# Patient Record
Sex: Male | Born: 1960 | Race: Black or African American | Hispanic: No | Marital: Married | Smoking: Never smoker
Health system: Southern US, Community
[De-identification: ages and names within clinical notes are randomized; demographics above are authoritative.]

## PROBLEM LIST (undated history)

## (undated) ENCOUNTER — Emergency Department (HOSPITAL_BASED_OUTPATIENT_CLINIC_OR_DEPARTMENT_OTHER): Admission: EM

## (undated) DIAGNOSIS — N400 Enlarged prostate without lower urinary tract symptoms: Secondary | ICD-10-CM

## (undated) DIAGNOSIS — K589 Irritable bowel syndrome without diarrhea: Secondary | ICD-10-CM

## (undated) DIAGNOSIS — I10 Essential (primary) hypertension: Secondary | ICD-10-CM

## (undated) DIAGNOSIS — K219 Gastro-esophageal reflux disease without esophagitis: Secondary | ICD-10-CM

---

## 2016-10-07 ENCOUNTER — Other Ambulatory Visit: Payer: Self-pay | Admitting: Orthopedic Surgery

## 2016-10-07 DIAGNOSIS — M4726 Other spondylosis with radiculopathy, lumbar region: Secondary | ICD-10-CM

## 2016-10-11 ENCOUNTER — Ambulatory Visit
Admission: RE | Admit: 2016-10-11 | Discharge: 2016-10-11 | Disposition: A | Discharge status: Home / Self Care | Payer: Self-pay | Source: Ambulatory Visit | Attending: Orthopedic Surgery | Admitting: Orthopedic Surgery

## 2016-10-11 DIAGNOSIS — M4726 Other spondylosis with radiculopathy, lumbar region: Secondary | ICD-10-CM

## 2019-12-09 ENCOUNTER — Encounter (HOSPITAL_BASED_OUTPATIENT_CLINIC_OR_DEPARTMENT_OTHER): Payer: Self-pay | Admitting: Emergency Medicine

## 2019-12-09 ENCOUNTER — Emergency Department (HOSPITAL_BASED_OUTPATIENT_CLINIC_OR_DEPARTMENT_OTHER): Payer: BC Managed Care – PPO

## 2019-12-09 ENCOUNTER — Other Ambulatory Visit: Payer: Self-pay

## 2019-12-09 ENCOUNTER — Emergency Department (HOSPITAL_BASED_OUTPATIENT_CLINIC_OR_DEPARTMENT_OTHER)
Admission: EM | Admit: 2019-12-09 | Discharge: 2019-12-09 | Disposition: A | Discharge status: Home / Self Care | Payer: BC Managed Care – PPO | Attending: Emergency Medicine | Admitting: Emergency Medicine

## 2019-12-09 DIAGNOSIS — R109 Unspecified abdominal pain: Secondary | ICD-10-CM | POA: Insufficient documentation

## 2019-12-09 LAB — URINALYSIS, ROUTINE W REFLEX MICROSCOPIC
Bilirubin Urine: NEGATIVE
Glucose, UA: NEGATIVE mg/dL
Hgb urine dipstick: NEGATIVE
Ketones, ur: NEGATIVE mg/dL
Leukocytes,Ua: NEGATIVE
Nitrite: NEGATIVE
Protein, ur: NEGATIVE mg/dL
Specific Gravity, Urine: >1.03 — ABNORMAL HIGH (ref 1.005–1.030)
pH: 5.5 (ref 5.0–8.0)

## 2019-12-09 LAB — BASIC METABOLIC PANEL
Anion gap: 9 (ref 5–15)
BUN: 19 mg/dL (ref 6–20)
CO2: 26 mmol/L (ref 22–32)
Calcium: 9 mg/dL (ref 8.9–10.3)
Chloride: 102 mmol/L (ref 98–111)
Creatinine, Ser: 1.33 mg/dL — ABNORMAL HIGH (ref 0.61–1.24)
GFR calc Af Amer: >60 mL/min (ref >60)
GFR calc non Af Amer: 59 mL/min — ABNORMAL LOW (ref >60)
Glucose, Bld: 97 mg/dL (ref 70–99)
Potassium: 4.3 mmol/L (ref 3.5–5.1)
Sodium: 137 mmol/L (ref 135–145)

## 2019-12-09 MED ORDER — LIDOCAINE 5 % EX PTCH: 1.0000 | MEDICATED_PATCH | CUTANEOUS | 0 refills | Status: AC

## 2019-12-09 MED ORDER — NAPROXEN 375 MG PO TABS
375.0000 mg | ORAL_TABLET | Freq: Two times a day (BID) | ORAL | 0 refills | Status: AC
Start: 2019-12-09 — End: ?

## 2019-12-09 MED ORDER — LIDOCAINE 5 % EX PTCH
2.0000 | MEDICATED_PATCH | CUTANEOUS | Status: DC
  Administered 2019-12-09: 2 via TRANSDERMAL
  Filled 2019-12-09: qty 2

## 2019-12-09 MED ORDER — ACETAMINOPHEN 500 MG PO TABS
1000.0000 mg | ORAL_TABLET | Freq: Once | ORAL | Status: AC
  Administered 2019-12-09: 1000 mg via ORAL
  Filled 2019-12-09: qty 2

## 2019-12-09 MED ORDER — IBUPROFEN 800 MG PO TABS
800.0000 mg | ORAL_TABLET | Freq: Once | ORAL | Status: AC
  Administered 2019-12-09: 800 mg via ORAL
  Filled 2019-12-09: qty 1

## 2019-12-09 NOTE — ED Provider Notes (Signed)
[facility] EMERGENCY DEPARTMENT Provider Note   CSN: [id number] Arrival date & time: 12/09/19  0346     History Chief Complaint  Patient presents with  . Flank Pain    Harry Spears is a 59 y.o. male.  The history is provided by the patient.  Flank Pain This is a new problem. The current episode started more than 2 days ago. The problem occurs constantly. The problem has not changed since onset.Pertinent negatives include no chest pain, no abdominal pain, no headaches and no shortness of breath. Nothing aggravates the symptoms. Nothing relieves the symptoms. He has tried nothing for the symptoms. The treatment provided no relief.       History reviewed. No pertinent past medical history.  There are no problems to display for this patient.   History reviewed. No pertinent surgical history.     History reviewed. No pertinent family history.  Social History   Tobacco Use  . Smoking status: Never Smoker  . Smokeless tobacco: Never Used  Substance Use Topics  . Alcohol use: Not Currently  . Drug use: Not Currently    Home Medications Prior to Admission medications   Not on File    Allergies    Patient has no allergy information on record.  Review of Systems   Review of Systems  Constitutional: Negative for fever.  HENT: Negative for congestion.   Eyes: Negative for visual disturbance.  Respiratory: Negative for shortness of breath.   Cardiovascular: Negative for chest pain.  Gastrointestinal: Negative for abdominal pain.  Genitourinary: Positive for flank pain. Negative for dysuria.  Musculoskeletal: Negative for arthralgias.  Skin: Negative for rash.  Neurological: Negative for headaches.  Psychiatric/Behavioral: Negative for agitation.  All other systems reviewed and are negative.   Physical Exam Updated Vital Signs BP (!) 145/69 (BP Location: Right Arm)   Pulse 72   Temp 98.5 F (36.9 C) (Oral)   Resp 18   Ht 5\' 11"  (1.803 m)   Wt 93.4  kg   SpO2 98%   BMI 28.73 kg/m   Physical Exam Vitals and nursing note reviewed.  Constitutional:      General: He is not in acute distress. HENT:     Head: Normocephalic and atraumatic.     Nose: Nose normal.  Eyes:     Conjunctiva/sclera: Conjunctivae normal.     Pupils: Pupils are equal, round, and reactive to light.  Cardiovascular:     Rate and Rhythm: Normal rate and regular rhythm.     Pulses: Normal pulses.     Heart sounds: Normal heart sounds.  Pulmonary:     Effort: Pulmonary effort is normal.     Breath sounds: Normal breath sounds.  Abdominal:     General: Abdomen is flat. Bowel sounds are normal.     Tenderness: There is no abdominal tenderness. There is no guarding or rebound.  Musculoskeletal:        General: Normal range of motion.     Cervical back: Normal range of motion and neck supple.  Skin:    General: Skin is warm and dry.     Capillary Refill: Capillary refill takes less than 2 seconds.  Neurological:     General: No focal deficit present.     Mental Status: He is alert and oriented to person, place, and time. Mental status is at baseline.     Deep Tendon Reflexes: Reflexes normal.  Psychiatric:        Mood and Affect: Mood normal.  Behavior: Behavior normal.     ED Results / Procedures / Treatments   Labs (all labs ordered are listed, but only abnormal results are displayed) Results for orders placed or performed during the hospital encounter of 12/09/19  Urinalysis, Routine w reflex microscopic Urine, Random  Result Value Ref Range   Color, Urine YELLOW YELLOW   APPearance CLEAR CLEAR   Specific Gravity, Urine >1.030 (H) 1.005 - 1.030   pH 5.5 5.0 - 8.0   Glucose, UA NEGATIVE NEGATIVE mg/dL   Hgb urine dipstick NEGATIVE NEGATIVE   Bilirubin Urine NEGATIVE NEGATIVE   Ketones, ur NEGATIVE NEGATIVE mg/dL   Protein, ur NEGATIVE NEGATIVE mg/dL   Nitrite NEGATIVE NEGATIVE   Leukocytes,Ua NEGATIVE NEGATIVE  Basic metabolic panel    Result Value Ref Range   Sodium 137 135 - 145 mmol/L   Potassium 4.3 3.5 - 5.1 mmol/L   Chloride 102 98 - 111 mmol/L   CO2 26 22 - 32 mmol/L   Glucose, Bld 97 70 - 99 mg/dL   BUN 19 6 - 20 mg/dL   Creatinine, Ser 2.67 (H) 0.61 - 1.24 mg/dL   Calcium 9.0 8.9 - 12.4 mg/dL   GFR calc non Af Amer 59 (L) >60 mL/min   GFR calc Af Amer >60 >60 mL/min   Anion gap 9 5 - 15   CT Renal Stone Study  Result Date: 12/09/2019 CLINICAL DATA:  Left flank pain EXAM: CT ABDOMEN AND PELVIS WITHOUT CONTRAST TECHNIQUE: Multidetector CT imaging of the abdomen and pelvis was performed following the standard protocol without IV contrast. COMPARISON:  08/01/2017 FINDINGS: Lower chest: Lung bases are clear. Hepatobiliary: Unenhanced liver is unremarkable. Gallbladder is unremarkable. No intrahepatic or extrahepatic ductal dilatation. Pancreas: Within normal limits. Spleen: Within normal limits. Adrenals/Urinary Tract: Adrenal glands are within normal limits. Bilateral renal cysts, measuring up to 2.2 cm in the right lower pole and 2.4 cm in the left lower pole. No renal, ureteral, or bladder calculi. No hydronephrosis. Bladder is within normal limits. Stomach/Bowel: Stomach is within normal limits. No evidence of bowel obstruction. Normal appendix (series 2/image 44). Vascular/Lymphatic: No evidence of abdominal aortic aneurysm. Atherosclerotic calcifications of the abdominal aorta and branch vessels. No suspicious abdominopelvic lymphadenopathy. Reproductive: Mild prostatomegaly. Other: No abdominopelvic ascites. Musculoskeletal: Degenerative changes of the visualized thoracolumbar spine. Mild fatty muscular atrophy on the right (series 2/image 72), chronic. IMPRESSION: No renal, ureteral, or bladder calculi.  No hydronephrosis. Bilateral renal cysts, measuring up to 2.4 cm on the left, benign. Electronically Signed   By: Charline Bills M.D.   On: 12/09/2019 04:42    Procedures Procedures (including critical care  time)  Medications Ordered in ED Medications  acetaminophen (TYLENOL) tablet 1,000 mg (has no administration in time range)  ibuprofen (ADVIL) tablet 800 mg (has no administration in time range)  lidocaine (LIDODERM) 5 % 2 patch (has no administration in time range)    ED Course  I have reviewed the triage vital signs and the nursing notes.  Pertinent labs & imaging results that were available during my care of the patient were reviewed by me and considered in my medical decision making (see chart for details).  Likely MSK cause of pain.  No UTI no stones.  Tylenol, lidoderm and naproxen for pain.    Harry Spears was evaluated in Emergency Department on 12/09/2019 for the symptoms described in the history of present illness. He was evaluated in the context of the global COVID-19 pandemic, which necessitated consideration that the patient  might be at risk for infection with the SARS-CoV-2 virus that causes COVID-19. Institutional protocols and algorithms that pertain to the evaluation of patients at risk for COVID-19 are in a state of rapid change based on information released by regulatory bodies including the CDC and federal and state organizations. These policies and algorithms were followed during the patient's care in the ED.  Final Clinical Impression(s) / ED Diagnoses  Return for intractable cough, coughing up blood,fevers >100.4 unrelieved by medication, shortness of breath, intractable vomiting, chest pain, shortness of breath, weakness,numbness, changes in speech, facial asymmetry,abdominal pain, passing out,Inability to tolerate liquids or food, cough, altered mental status or any concerns. No signs of systemic illness or infection. The patient is nontoxic-appearing on exam and vital signs are within normal limits.   I have reviewed the triage vital signs and the nursing notes. Pertinent labs &imaging results that were available during my care of the patient were reviewed by me  and considered in my medical decision making (see chart for details).After history, exam, and medical workup I feel the patient has beenappropriately medically screened and is safe for discharge home. Pertinent diagnoses were discussed with the patient. Patient was given return precautions.        Harry Sturdevant, MD 12/09/19 [phone]

## 2019-12-09 NOTE — ED Triage Notes (Signed)
Pt c/o left flank pain for the past few days and feeling bloated, pt denies any urinary symptoms.

## 2019-12-13 ENCOUNTER — Emergency Department (HOSPITAL_BASED_OUTPATIENT_CLINIC_OR_DEPARTMENT_OTHER)
Admission: EM | Admit: 2019-12-13 | Discharge: 2019-12-13 | Disposition: A | Discharge status: Home / Self Care | Payer: BC Managed Care – PPO | Attending: Emergency Medicine | Admitting: Emergency Medicine

## 2019-12-13 ENCOUNTER — Other Ambulatory Visit: Payer: Self-pay

## 2019-12-13 ENCOUNTER — Encounter (HOSPITAL_BASED_OUTPATIENT_CLINIC_OR_DEPARTMENT_OTHER): Payer: Self-pay

## 2019-12-13 DIAGNOSIS — K5900 Constipation, unspecified: Secondary | ICD-10-CM | POA: Diagnosis not present

## 2019-12-13 LAB — OCCULT BLOOD X 1 CARD TO LAB, STOOL: Fecal Occult Bld: NEGATIVE

## 2019-12-13 MED ORDER — POLYETHYLENE GLYCOL 3350 17 GM/SCOOP PO POWD: 1.0000 | Freq: Once | ORAL | 0 refills | Status: AC

## 2019-12-13 NOTE — ED Triage Notes (Signed)
Pt c/o constipation x 4 days-NAD-steady gait

## 2019-12-13 NOTE — Discharge Instructions (Addendum)

## 2019-12-13 NOTE — ED Provider Notes (Signed)
[facility] EMERGENCY DEPARTMENT Provider Note   CSN: [id number] Arrival date & time: 12/13/19  1126     History Chief Complaint  Patient presents with  . Constipation    Harry Spears is a 59 y.o. male.  HPI Patient is a 59 year old gentleman with no pertinent past medical history presented today for constipation for 4 days.  He states he actually has been having bowel movements but they have been less frequent.  He states approximately every other day.  He had a bowel movement this morning that was soft brown nontarry nonpainful.  Denies any bright red blood per rectum.  Denies any abdominal pain nausea vomiting or diarrhea. He states he feels otherwise well but states that his normal baseline bowel movements are once per day and he is concerned about his change in bowel movement status.     History reviewed. No pertinent past medical history.  There are no problems to display for this patient.   History reviewed. No pertinent surgical history.     No family history on file.  Social History   Tobacco Use  . Smoking status: Never Smoker  . Smokeless tobacco: Never Used  Vaping Use  . Vaping Use: Never used  Substance Use Topics  . Alcohol use: Not Currently  . Drug use: Not Currently    Home Medications Prior to Admission medications   Medication Sig Start Date End Date Taking? Authorizing Provider  lidocaine (LIDODERM) 5 % Place 1 patch onto the skin daily. Remove & Discard patch within 12 hours or as directed by MD 12/09/19   Nicanor Alcon, April, MD  naproxen (NAPROSYN) 375 MG tablet Take 1 tablet (375 mg total) by mouth 2 (two) times daily. 12/09/19   Palumbo, April, MD  polyethylene glycol powder (GLYCOLAX/MIRALAX) 17 GM/SCOOP powder Take 255 g by mouth once for 1 dose. Start with one cap per meal in water. You may slowly titrate up until you have soft normal BMs. 12/13/19 12/13/19  Gailen Shelter, PA    Allergies    Patient has no known allergies.  Review  of Systems   Review of Systems  Constitutional: Negative for chills and fever.  HENT: Negative for congestion.   Respiratory: Negative for shortness of breath.   Cardiovascular: Negative for chest pain.  Gastrointestinal: Positive for constipation. Negative for abdominal pain, nausea and vomiting.  Musculoskeletal: Negative for neck pain.    Physical Exam Updated Vital Signs BP (!) 157/93 (BP Location: Left Arm)   Pulse 74   Temp 98.8 F (37.1 C) (Oral)   Resp 18   Ht 5\' 10"  (1.778 m)   Wt 89.8 kg   SpO2 98%   BMI 28.41 kg/m   Physical Exam Vitals and nursing note reviewed. Exam conducted with a chaperone present RT).  Constitutional:      General: He is not in acute distress. HENT:     Head: Normocephalic and atraumatic.     Nose: Nose normal.  Eyes:     General: No scleral icterus. Cardiovascular:     Rate and Rhythm: Normal rate and regular rhythm.     Pulses: Normal pulses.     Heart sounds: Normal heart sounds.  Pulmonary:     Effort: Pulmonary effort is normal. No respiratory distress.     Breath sounds: No wheezing.  Abdominal:     Palpations: Abdomen is soft.     Tenderness: There is no abdominal tenderness. There is no right CVA tenderness, left CVA tenderness,  guarding or rebound.  Genitourinary:    Rectum: Normal.     Comments: No palpable stool in rectal vault.  Stool is soft and brown with no melena or hematochezia.  POC Hemoccult negative. Musculoskeletal:     Cervical back: Normal range of motion.     Right lower leg: No edema.     Left lower leg: No edema.  Skin:    General: Skin is warm and dry.     Capillary Refill: Capillary refill takes less than 2 seconds.  Neurological:     Mental Status: He is alert. Mental status is at baseline.  Psychiatric:        Mood and Affect: Mood normal.        Behavior: Behavior normal.     ED Results / Procedures / Treatments   Labs (all labs ordered are listed, but only abnormal results are  displayed) Labs Reviewed  OCCULT BLOOD X 1 CARD TO LAB, STOOL    EKG None  Radiology No results found.  Procedures Procedures (including critical care time)  Medications Ordered in ED Medications - No data to display  ED Course  I have reviewed the triage vital signs and the nursing notes.  Pertinent labs & imaging results that were available during my care of the patient were reviewed by me and considered in my medical decision making (see chart for details).    MDM Rules/Calculators/A&P                          Patient is 59 year old gentleman presented today with concern for change in bowel movements from once daily to once every other day.  He denies any abdominal pain nausea vomiting or diarrhea.  He has no other symptoms.  He states soft brown stool.  My examination was unremarkable.  He has no abdominal tenderness and has soft brown stool with no melena hematochezia.  Guaiac negative.  We will discharge patient with recommendations for MiraLAX use.  Doubt SBO as he is still passing gas and stool.  Doubt volvulus or any other acute abdominal process.  Patient discharged in good condition with vital signs within normal limits he will follow closely with gastroenterology to whom he was given referral and follow-up/return to the emergency department if he has any new or concerning symptoms.  Final Clinical Impression(s) / ED Diagnoses Final diagnoses:  Constipation, unspecified constipation type    Rx / DC Orders ED Discharge Orders         Ordered    polyethylene glycol powder (GLYCOLAX/MIRALAX) 17 GM/SCOOP powder   Once     Discontinue  Reprint     12/13/19 1241           Gailen Shelter, [place] 12/13/19 1244    Jacalyn Lefevre, MD 12/13/19 1446

## 2020-01-02 ENCOUNTER — Encounter (HOSPITAL_BASED_OUTPATIENT_CLINIC_OR_DEPARTMENT_OTHER): Payer: Self-pay | Admitting: *Deleted

## 2020-01-02 ENCOUNTER — Emergency Department (HOSPITAL_BASED_OUTPATIENT_CLINIC_OR_DEPARTMENT_OTHER): Payer: BC Managed Care – PPO

## 2020-01-02 ENCOUNTER — Other Ambulatory Visit: Payer: Self-pay

## 2020-01-02 ENCOUNTER — Emergency Department (HOSPITAL_BASED_OUTPATIENT_CLINIC_OR_DEPARTMENT_OTHER)
Admission: EM | Admit: 2020-01-02 | Discharge: 2020-01-03 | Disposition: A | Discharge status: Home / Self Care | Payer: BC Managed Care – PPO | Attending: Emergency Medicine | Admitting: Emergency Medicine

## 2020-01-02 DIAGNOSIS — Z79899 Other long term (current) drug therapy: Secondary | ICD-10-CM | POA: Insufficient documentation

## 2020-01-02 DIAGNOSIS — I10 Essential (primary) hypertension: Secondary | ICD-10-CM | POA: Insufficient documentation

## 2020-01-02 DIAGNOSIS — G609 Hereditary and idiopathic neuropathy, unspecified: Secondary | ICD-10-CM

## 2020-01-02 DIAGNOSIS — R531 Weakness: Secondary | ICD-10-CM | POA: Insufficient documentation

## 2020-01-02 DIAGNOSIS — Z23 Encounter for immunization: Secondary | ICD-10-CM | POA: Insufficient documentation

## 2020-01-02 HISTORY — DX: Essential (primary) hypertension: I10
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
## 2020-01-03 ENCOUNTER — Emergency Department (HOSPITAL_BASED_OUTPATIENT_CLINIC_OR_DEPARTMENT_OTHER): Payer: BC Managed Care – PPO

## 2020-01-03 ENCOUNTER — Encounter (HOSPITAL_BASED_OUTPATIENT_CLINIC_OR_DEPARTMENT_OTHER): Payer: Self-pay | Admitting: Emergency Medicine

## 2020-01-03 LAB — CBC WITH DIFFERENTIAL/PLATELET
Abs Immature Granulocytes: 0.01 10*3/uL (ref 0.00–0.07)
Basophils Absolute: 0 10*3/uL (ref 0.0–0.1)
Basophils Relative: 0 %
Eosinophils Absolute: 0 10*3/uL (ref 0.0–0.5)
Eosinophils Relative: 0 %
HCT: 46.3 % (ref 39.0–52.0)
Hemoglobin: 15.2 g/dL (ref 13.0–17.0)
Immature Granulocytes: 0 %
Lymphocytes Relative: 23 %
Lymphs Abs: 1.4 10*3/uL (ref 0.7–4.0)
MCH: 29 pg (ref 26.0–34.0)
MCHC: 32.8 g/dL (ref 30.0–36.0)
MCV: 88.2 fL (ref 80.0–100.0)
Monocytes Absolute: 0.6 10*3/uL (ref 0.1–1.0)
Monocytes Relative: 11 %
Neutro Abs: 4 10*3/uL (ref 1.7–7.7)
Neutrophils Relative %: 66 %
Platelets: 229 10*3/uL (ref 150–400)
RBC: 5.25 MIL/uL (ref 4.22–5.81)
RDW: 12.8 % (ref 11.5–15.5)
WBC: 6.1 10*3/uL (ref 4.0–10.5)
nRBC: 0 % (ref 0.0–0.2)

## 2020-01-03 LAB — COMPREHENSIVE METABOLIC PANEL
ALT: 25 U/L (ref 0–44)
AST: 22 U/L (ref 15–41)
Albumin: 4.2 g/dL (ref 3.5–5.0)
Alkaline Phosphatase: 64 U/L (ref 38–126)
Anion gap: 10 (ref 5–15)
BUN: 18 mg/dL (ref 6–20)
CO2: 23 mmol/L (ref 22–32)
Calcium: 9.3 mg/dL (ref 8.9–10.3)
Chloride: 104 mmol/L (ref 98–111)
Creatinine, Ser: 1.12 mg/dL (ref 0.61–1.24)
GFR calc Af Amer: >60 mL/min (ref >60)
GFR calc non Af Amer: >60 mL/min (ref >60)
Glucose, Bld: 108 mg/dL — ABNORMAL HIGH (ref 70–99)
Potassium: 4.2 mmol/L (ref 3.5–5.1)
Sodium: 137 mmol/L (ref 135–145)
Total Bilirubin: 0.5 mg/dL (ref 0.3–1.2)
Total Protein: 7.5 g/dL (ref 6.5–8.1)

## 2020-01-03 LAB — TROPONIN I (HIGH SENSITIVITY): Troponin I (High Sensitivity): 4 ng/L (ref <18)

## 2020-01-03 MED ORDER — TETANUS-DIPHTH-ACELL PERTUSSIS 5-2.5-18.5 LF-MCG/0.5 IM SUSP
0.5000 mL | Freq: Once | INTRAMUSCULAR | Status: AC
  Administered 2020-01-03: 0.5 mL via INTRAMUSCULAR
  Filled 2020-01-03: qty 0.5
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
## 2020-01-03 NOTE — ED Notes (Signed)
Pt ambulatory to bathroom without difficulty.  

## 2020-04-21 ENCOUNTER — Other Ambulatory Visit: Payer: Self-pay

## 2020-04-21 ENCOUNTER — Encounter (HOSPITAL_BASED_OUTPATIENT_CLINIC_OR_DEPARTMENT_OTHER): Payer: Self-pay | Admitting: Emergency Medicine

## 2020-04-21 ENCOUNTER — Emergency Department (HOSPITAL_BASED_OUTPATIENT_CLINIC_OR_DEPARTMENT_OTHER)
Admission: EM | Admit: 2020-04-21 | Discharge: 2020-04-22 | Disposition: A | Discharge status: Home / Self Care | Payer: BC Managed Care – PPO | Source: Home / Self Care | Attending: Emergency Medicine | Admitting: Emergency Medicine

## 2020-04-21 ENCOUNTER — Emergency Department (HOSPITAL_BASED_OUTPATIENT_CLINIC_OR_DEPARTMENT_OTHER)
Admission: EM | Admit: 2020-04-21 | Discharge: 2020-04-21 | Disposition: A | Discharge status: Home / Self Care | Payer: BC Managed Care – PPO | Attending: Emergency Medicine | Admitting: Emergency Medicine

## 2020-04-21 DIAGNOSIS — I10 Essential (primary) hypertension: Secondary | ICD-10-CM | POA: Insufficient documentation

## 2020-04-21 DIAGNOSIS — Z79899 Other long term (current) drug therapy: Secondary | ICD-10-CM | POA: Insufficient documentation

## 2020-04-21 DIAGNOSIS — Z8616 Personal history of COVID-19: Secondary | ICD-10-CM | POA: Insufficient documentation

## 2020-04-21 NOTE — ED Triage Notes (Signed)
Reports hypertension this month since having covid the first week of the month. At [facility] hospital night for same - reports his BP was over 200 systolic yesterday.

## 2020-04-21 NOTE — ED Triage Notes (Signed)
Reports having elevated BP since having covid.  Seen Friday at [facility], left last night without being seen.  Denies having any pain.  Has an appointment on Tuesday with PCP.

## 2020-04-21 NOTE — ED Notes (Signed)
Per registration, left without being seen at this time

## 2020-04-22 NOTE — Discharge Instructions (Addendum)
You are seen today with concerns for high blood pressure.  While your blood pressure was slightly elevated, is not in any severe range.  Follow-up with your primary doctor as scheduled.  Only take your blood pressure if you are feeling poorly.  Make sure to take your medications on schedule

## 2020-04-22 NOTE — ED Provider Notes (Signed)
[facility] EMERGENCY DEPARTMENT Provider Note   CSN: [id number] Arrival date & time: 04/21/20  2331     History Chief Complaint  Patient presents with  . Hypertension    Harry Spears is a 59 y.o. male.  HPI     This a 59 year old male with a history of hypertension who presents with concerns for recently elevated blood pressures.  Patient reports that he has a history of high blood pressure.  He reports that his blood pressures have been well controlled until he got Covid in early December.  He states that he tested positive on December 3.  Since that time he reports that his blood pressures have been elevated as high as systolic in the 200s.  He was seen at an outside hospital on Friday night and reports that they did not find anything wrong.  He takes clonidine twice a day and losartan.  He reports compliance although sometimes he goes longer than 12 hours to take his second dose of clonidine.  He randomly takes his blood pressure daily before going to work.  He has no physical complaints at this time including chest pain, shortness of breath, headache, strokelike symptoms.  He states he is just very concerned his number.  He has a primary care appointment on Tuesday.  Chart reviewed.  Unable to see outside hospital records from Friday night.  Past Medical History:  Diagnosis Date  . Hypertension     There are no problems to display for this patient.   History reviewed. No pertinent surgical history.     No family history on file.  Social History   Tobacco Use  . Smoking status: Never Smoker  . Smokeless tobacco: Never Used  Vaping Use  . Vaping Use: Never used  Substance Use Topics  . Alcohol use: Not Currently  . Drug use: Not Currently    Home Medications Prior to Admission medications   Medication Sig Start Date End Date Taking? Authorizing Provider  cloNIDine (CATAPRES) 0.3 MG tablet Take 0.3 mg by mouth 2 (two) times daily. 12/04/19  Yes [provider]  losartan (COZAAR) 100 MG tablet Take 100 mg by mouth daily. 12/29/19  Yes [provider]  lidocaine (LIDODERM) 5 % Place 1 patch onto the skin daily. Remove & Discard patch within 12 hours or as directed by MD 12/09/19   Nicanor Alcon, April, MD  naproxen (NAPROSYN) 375 MG tablet Take 1 tablet (375 mg total) by mouth 2 (two) times daily. 12/09/19   Palumbo, April, MD  pravastatin (PRAVACHOL) 40 MG tablet Take by mouth. 08/06/16   [provider]  rosuvastatin (CRESTOR) 10 MG tablet Take 10 mg by mouth daily.    [provider]  zolpidem (AMBIEN) 10 MG tablet Take 10 mg by mouth at bedtime as needed for sleep.    [provider]    Allergies    Patient has no known allergies.  Review of Systems   Review of Systems  Constitutional: Negative for fever.  Respiratory: Negative for shortness of breath.   Cardiovascular: Negative for chest pain.  Neurological: Negative for weakness, numbness and headaches.  All other systems reviewed and are negative.   Physical Exam Updated Vital Signs BP (!) 167/105 (BP Location: Right Arm)   Pulse 64   Temp 98.1 F (36.7 C) (Oral)   Resp 17   Ht 1.778 m (5\' 10" )   Wt 89.4 kg   SpO2 100%   BMI 28.28 kg/m   Physical  Exam Vitals and nursing note reviewed.  Constitutional:      Appearance: He is well-developed and well-nourished. He is not ill-appearing.  HENT:     Head: Normocephalic and atraumatic.     Nose: Nose normal.     Mouth/Throat:     Mouth: Mucous membranes are moist.  Eyes:     Pupils: Pupils are equal, round, and reactive to light.  Cardiovascular:     Rate and Rhythm: Normal rate and regular rhythm.     Heart sounds: Normal heart sounds. No murmur heard.   Pulmonary:     Effort: Pulmonary effort is normal. No respiratory distress.     Breath sounds: Normal breath sounds. No wheezing.  Abdominal:     Palpations: Abdomen is soft.     Tenderness: There is no abdominal tenderness.   Musculoskeletal:        General: No edema.     Cervical back: Neck supple.     Right lower leg: No edema.     Left lower leg: No edema.  Lymphadenopathy:     Cervical: No cervical adenopathy.  Skin:    General: Skin is warm and dry.  Neurological:     Mental Status: He is alert and oriented to person, place, and time.  Psychiatric:        Mood and Affect: Mood and affect and mood normal.     ED Results / Procedures / Treatments   Labs (all labs ordered are listed, but only abnormal results are displayed) Labs Reviewed - No data to display  EKG None  Radiology No results found.  Procedures Procedures (including critical care time)  Medications Ordered in ED Medications - No data to display  ED Course  I have reviewed the triage vital signs and the nursing notes.  Pertinent labs & imaging results that were available during my care of the patient were reviewed by me and considered in my medical decision making (see chart for details).    MDM Rules/Calculators/A&P                          Patient presents with concerns for high blood pressure.  He is overall nontoxic-appearing.  Blood pressure 167/105.  He has no physical complaints at this time.  Reports that his blood pressure has been poorly managed since Covid diagnosis several weeks ago.  I reviewed his chart.  Recent renal ultrasound was negative from an outside facility.  I cannot review his notes from Friday.  EKG shows no evidence of acute ischemia or arrhythmia.  He is currently asymptomatic.  Do not feel his blood pressure is causing an acute emergent condition at this time.  While longstanding blood pressure has significant risks, it is best to follow-up with his primary physician for medication management.  I also discussed with him that clonidine is notorious for rebound hypertension.  If he misses any doses, it would be expected for his blood pressure to increase.  Additionally, I encouraged him not to take his  blood pressure unless he felt poorly or his primary physician requested a blood pressure log.  Patient stated understanding.  He was reassured.  At this time no work-up indicated  After history, exam, and medical workup I feel the patient has been appropriately medically screened and is safe for discharge home. Pertinent diagnoses were discussed with the patient. Patient was given return precautions.  Final Clinical Impression(s) / ED Diagnoses Final diagnoses:  Primary hypertension  Rx / DC Orders ED Discharge Orders    None       Anida Deol, Mayer Masker, MD 04/22/20 [phone]

## 2021-01-06 ENCOUNTER — Other Ambulatory Visit: Payer: Self-pay

## 2021-01-06 ENCOUNTER — Encounter (HOSPITAL_BASED_OUTPATIENT_CLINIC_OR_DEPARTMENT_OTHER): Payer: Self-pay

## 2021-01-06 ENCOUNTER — Emergency Department (HOSPITAL_BASED_OUTPATIENT_CLINIC_OR_DEPARTMENT_OTHER)
Admission: EM | Admit: 2021-01-06 | Discharge: 2021-01-06 | Disposition: A | Discharge status: Home / Self Care | Payer: BC Managed Care – PPO | Attending: Emergency Medicine | Admitting: Emergency Medicine

## 2021-01-06 DIAGNOSIS — I10 Essential (primary) hypertension: Secondary | ICD-10-CM | POA: Diagnosis not present

## 2021-01-06 DIAGNOSIS — Z79899 Other long term (current) drug therapy: Secondary | ICD-10-CM | POA: Diagnosis not present

## 2021-01-06 DIAGNOSIS — R350 Frequency of micturition: Secondary | ICD-10-CM | POA: Diagnosis present

## 2021-01-06 LAB — OCCULT BLOOD X 1 CARD TO LAB, STOOL: Fecal Occult Bld: NEGATIVE

## 2021-01-06 NOTE — Discharge Instructions (Addendum)
We discussed please follow-up with your primary care for further evaluation of your prostate.  I do not see any abnormality today on your rectal exam.  There was no blood in your stool.

## 2021-01-06 NOTE — ED Provider Notes (Signed)
[facility] EMERGENCY DEPARTMENT Provider Note   CSN: [id number] Arrival date & time: 01/06/21  1018     History Chief Complaint  Patient presents with   Urinary Frequency    Harry Spears is a 60 y.o. male with past medical history significant for hypertension, elevated PSA who presents with concern, anxiety about potential prostate cancer.  He has had no changes in his urinary frequency, he has had some urinary frequency for a long time secondary to his blood pressure medication, does not feel he is having difficulty urinating, does not feel that he is having difficulty emptying his bladder, does not have any pain with urination.  Patient reports he has had a biopsy of his prostate a few years ago, which was not abnormal.  Patient does endorse some dark stool recently, however recently had an endoscopy and biopsy without any abnormal results.  Patient denies diarrhea constipation, constipation, he believes some of the dark stool is secondary to his diet and has not changed in a long time.  Just wants a prostate exam today to rule out any obvious mass on his prostate.   Urinary Frequency      Past Medical History:  Diagnosis Date   Hypertension     There are no problems to display for this patient.   History reviewed. No pertinent surgical history.     History reviewed. No pertinent family history.  Social History   Tobacco Use   Smoking status: Never   Smokeless tobacco: Never  Vaping Use   Vaping Use: Never used  Substance Use Topics   Alcohol use: Never   Drug use: Never    Home Medications Prior to Admission medications   Medication Sig Start Date End Date Taking? Authorizing Provider  cloNIDine (CATAPRES) 0.3 MG tablet Take 0.3 mg by mouth 2 (two) times daily. 12/04/19   [provider]  lidocaine (LIDODERM) 5 % Place 1 patch onto the skin daily. Remove & Discard patch within 12 hours or as directed by MD 12/09/19   Nicanor Alcon, April, MD  losartan  (COZAAR) 100 MG tablet Take 100 mg by mouth daily. 12/29/19   [provider]  naproxen (NAPROSYN) 375 MG tablet Take 1 tablet (375 mg total) by mouth 2 (two) times daily. 12/09/19   Palumbo, April, MD  pravastatin (PRAVACHOL) 40 MG tablet Take by mouth. 08/06/16   [provider]  rosuvastatin (CRESTOR) 10 MG tablet Take 10 mg by mouth daily.    [provider]  zolpidem (AMBIEN) 10 MG tablet Take 10 mg by mouth at bedtime as needed for sleep.    [provider]    Allergies    Patient has no known allergies.  Review of Systems   Review of Systems  Gastrointestinal:  Negative for blood in stool, constipation, diarrhea and rectal pain.  Genitourinary:  Positive for frequency. Negative for difficulty urinating, dysuria and urgency.  All other systems reviewed and are negative.  Physical Exam Updated Vital Signs BP (!) 149/97 (BP Location: Right Arm)   Pulse 66   Temp 98.1 F (36.7 C) (Oral)   Resp 18   Ht 5\' 10"  (1.778 m)   Wt 91.2 kg   SpO2 99%   BMI 28.84 kg/m   Physical Exam Vitals and nursing note reviewed.  Constitutional:      General: He is not in acute distress.    Appearance: Normal appearance.  HENT:     Head: Normocephalic and atraumatic.  Eyes:  General:        Right eye: No discharge.        Left eye: No discharge.  Cardiovascular:     Rate and Rhythm: Normal rate and regular rhythm.     Heart sounds: No murmur heard.   No friction rub. No gallop.  Pulmonary:     Effort: Pulmonary effort is normal.     Breath sounds: Normal breath sounds.  Abdominal:     General: Bowel sounds are normal.     Palpations: Abdomen is soft.  Genitourinary:    Prostate: Normal.     Rectum: Normal.     Comments: No obvious abnormality of prostate on digital rectal exam, no asymmetry no mass, tenderness to palpation Skin:    General: Skin is warm and dry.     Capillary Refill: Capillary refill takes less than 2 seconds.  Neurological:      Mental Status: He is alert and oriented to person, place, and time.  Psychiatric:        Mood and Affect: Mood normal.        Behavior: Behavior normal.    ED Results / Procedures / Treatments   Labs (all labs ordered are listed, but only abnormal results are displayed) Labs Reviewed  OCCULT BLOOD X 1 CARD TO LAB, STOOL    EKG None  Radiology No results found.  Procedures Procedures   Medications Ordered in ED Medications - No data to display  ED Course  I have reviewed the triage vital signs and the nursing notes.  Pertinent labs & imaging results that were available during my care of the patient were reviewed by me and considered in my medical decision making (see chart for details).    MDM Rules/Calculators/A&P                         Discussed I do not appreciate any abnormality of the prostate on digital rectal exam.  Hemoccult negative for blood in stool.  No new or concerning urinary features, no difficulty with stool.  Recommend the patient follow-up with his primary care doctor for further evaluation of his prostate, continue to get PSA measurements as directed.  Return precautions discussed. Final Clinical Impression(s) / ED Diagnoses Final diagnoses:  Urinary frequency    Rx / DC Orders ED Discharge Orders     None        Olene Floss, PA-C 01/06/21 1207    Virgina Norfolk, DO 01/06/21 1541

## 2021-01-06 NOTE — ED Triage Notes (Signed)
Pt states he wants to get his "prostate checked". States his numbers were increased and his provider cant see him for "a while". States having some urinary frequency, denies other symptoms.

## 2021-01-15 ENCOUNTER — Encounter (HOSPITAL_BASED_OUTPATIENT_CLINIC_OR_DEPARTMENT_OTHER): Payer: Self-pay

## 2021-01-15 ENCOUNTER — Emergency Department (HOSPITAL_BASED_OUTPATIENT_CLINIC_OR_DEPARTMENT_OTHER): Payer: BC Managed Care – PPO

## 2021-01-15 ENCOUNTER — Other Ambulatory Visit: Payer: Self-pay

## 2021-01-15 ENCOUNTER — Emergency Department (HOSPITAL_BASED_OUTPATIENT_CLINIC_OR_DEPARTMENT_OTHER)
Admission: EM | Admit: 2021-01-15 | Discharge: 2021-01-15 | Disposition: A | Discharge status: Home / Self Care | Payer: BC Managed Care – PPO | Attending: Emergency Medicine | Admitting: Emergency Medicine

## 2021-01-15 DIAGNOSIS — I10 Essential (primary) hypertension: Secondary | ICD-10-CM | POA: Insufficient documentation

## 2021-01-15 DIAGNOSIS — R3915 Urgency of urination: Secondary | ICD-10-CM | POA: Diagnosis not present

## 2021-01-15 DIAGNOSIS — Z79899 Other long term (current) drug therapy: Secondary | ICD-10-CM | POA: Diagnosis not present

## 2021-01-15 LAB — BASIC METABOLIC PANEL
Anion gap: 10 (ref 5–15)
BUN: 14 mg/dL (ref 6–20)
CO2: 26 mmol/L (ref 22–32)
Calcium: 9.8 mg/dL (ref 8.9–10.3)
Chloride: 97 mmol/L — ABNORMAL LOW (ref 98–111)
Creatinine, Ser: 1.06 mg/dL (ref 0.61–1.24)
GFR, Estimated: >60 mL/min (ref >60)
Glucose, Bld: 108 mg/dL — ABNORMAL HIGH (ref 70–99)
Potassium: 3.8 mmol/L (ref 3.5–5.1)
Sodium: 133 mmol/L — ABNORMAL LOW (ref 135–145)

## 2021-01-15 LAB — URINALYSIS, ROUTINE W REFLEX MICROSCOPIC
Bilirubin Urine: NEGATIVE
Glucose, UA: NEGATIVE mg/dL
Hgb urine dipstick: NEGATIVE
Ketones, ur: NEGATIVE mg/dL
Leukocytes,Ua: NEGATIVE
Nitrite: NEGATIVE
Protein, ur: NEGATIVE mg/dL
Specific Gravity, Urine: 1.01 (ref 1.005–1.030)
pH: 6 (ref 5.0–8.0)

## 2021-01-15 MED ORDER — LOSARTAN POTASSIUM 25 MG PO TABS
100.0000 mg | ORAL_TABLET | Freq: Once | ORAL | Status: DC
  Filled 2021-01-15: qty 4

## 2021-01-15 MED ORDER — CLONIDINE HCL 0.1 MG PO TABS
0.3000 mg | ORAL_TABLET | Freq: Once | ORAL | Status: AC
  Administered 2021-01-15: 0.3 mg via ORAL
  Filled 2021-01-15: qty 3

## 2021-01-15 MED ORDER — LACTATED RINGERS IV BOLUS
1000.0000 mL | Freq: Once | INTRAVENOUS | Status: AC
  Administered 2021-01-15: 1000 mL via INTRAVENOUS

## 2021-01-15 MED ORDER — AMLODIPINE BESYLATE 5 MG PO TABS
10.0000 mg | ORAL_TABLET | Freq: Once | ORAL | Status: DC
  Filled 2021-01-15: qty 2

## 2021-01-15 NOTE — ED Triage Notes (Signed)
Patient reports blood pressure higher than normal last 2 days, seen at hospital in [place] who prescribed hctz 25 mg.  Reports BP numbers ~ 180/110 prior to arrival.   Denies any other symptoms like headache, chest pain or dizziness.

## 2021-01-15 NOTE — Discharge Instructions (Signed)
Talk to your primary care doctor soon as possible to discuss blood pressure medications.  This is a chronic condition and your medication needs can change over time.  Avoid rapid increases in blood pressure medications that may drop your blood pressure to dangerous levels.

## 2021-01-15 NOTE — ED Provider Notes (Signed)
[facility] EMERGENCY DEPARTMENT Provider Note   CSN: [id number] Arrival date & time: 01/15/21  1046     History Chief Complaint  Patient presents with   Hypertension    Harry Spears is a 60 y.o. male.   Hypertension Pertinent negatives include no chest pain, no abdominal pain, no headaches and no shortness of breath. Patient presents for asymptomatic hypertension.  He has a history of chronic hypertension and was previously on clonidine, 0.3 mg twice daily and losartan, 100 mg daily.  He discontinued the clonidine 2 weeks ago.  At that time, he was started on amlodipine 10 mg daily.  He noticed some increased blood pressure readings at home yesterday.  He went to [facility] ED today.  He was given a prescription for HCTZ, 25 mg daily.  He did take 1 dose of this at 10 AM.  Last time he took his home blood pressure medications was 3 AM.  At [facility], they did not do any testing.  Patient presents to the ED due to concerns of continued elevated blood pressure.  He does see a primary care doctor who manages his blood pressure medications.  Patient denies any headache, vision changes, numbness, weakness, chest pain, shortness of breath, abdominal pain.  He has had increased urination since his dose of HCTZ.     Past Medical History:  Diagnosis Date   Hypertension     There are no problems to display for this patient.   History reviewed. No pertinent surgical history.     History reviewed. No pertinent family history.  Social History   Tobacco Use   Smoking status: Never   Smokeless tobacco: Never  Vaping Use   Vaping Use: Never used  Substance Use Topics   Alcohol use: Never   Drug use: Never    Home Medications Prior to Admission medications   Medication Sig Start Date End Date Taking? Authorizing Provider  cloNIDine (CATAPRES) 0.3 MG tablet Take 0.3 mg by mouth 2 (two) times daily. 12/04/19   [provider]  lidocaine (LIDODERM) 5 % Place 1 patch onto  the skin daily. Remove & Discard patch within 12 hours or as directed by MD 12/09/19   Nicanor Alcon, April, MD  losartan (COZAAR) 100 MG tablet Take 100 mg by mouth daily. 12/29/19   [provider]  naproxen (NAPROSYN) 375 MG tablet Take 1 tablet (375 mg total) by mouth 2 (two) times daily. 12/09/19   Palumbo, April, MD  pravastatin (PRAVACHOL) 40 MG tablet Take by mouth. 08/06/16   [provider]  rosuvastatin (CRESTOR) 10 MG tablet Take 10 mg by mouth daily.    [provider]  zolpidem (AMBIEN) 10 MG tablet Take 10 mg by mouth at bedtime as needed for sleep.    [provider]    Allergies    Patient has no known allergies.  Review of Systems   Review of Systems  Constitutional:  Negative for chills, fatigue and fever.  HENT:  Negative for ear pain and sore throat.   Eyes:  Negative for pain and visual disturbance.  Respiratory:  Negative for cough, chest tightness and shortness of breath.   Cardiovascular:  Negative for chest pain, palpitations and leg swelling.  Gastrointestinal:  Negative for abdominal pain, nausea and vomiting.  Genitourinary:  Negative for dysuria and hematuria.  Musculoskeletal:  Negative for arthralgias and back pain.  Skin:  Negative for color change and rash.  Neurological:  Negative for dizziness, seizures, syncope, weakness, light-headedness, numbness  and headaches.  All other systems reviewed and are negative.  Physical Exam Updated Vital Signs BP (!) 142/104 (BP Location: Left Arm)   Pulse 89   Temp 98.2 F (36.8 C) (Oral)   Resp 18   Ht 5\' 10"  (1.778 m)   Wt 89.8 kg   SpO2 99%   BMI 28.41 kg/m   Physical Exam Vitals and nursing note reviewed.  Constitutional:      General: He is not in acute distress.    Appearance: Normal appearance. He is well-developed and normal weight. He is not ill-appearing, toxic-appearing or diaphoretic.  HENT:     Head: Normocephalic and atraumatic.     Right Ear: External ear normal.      Left Ear: External ear normal.     Nose: Nose normal.  Eyes:     General: No visual field deficit.    Conjunctiva/sclera: Conjunctivae normal.  Cardiovascular:     Rate and Rhythm: Normal rate and regular rhythm.     Heart sounds: No murmur heard. Pulmonary:     Effort: Pulmonary effort is normal. No respiratory distress.     Breath sounds: Normal breath sounds. No wheezing or rales.  Abdominal:     Palpations: Abdomen is soft.     Tenderness: There is no abdominal tenderness.  Musculoskeletal:        General: Normal range of motion.     Cervical back: Neck supple.     Right lower leg: No edema.     Left lower leg: No edema.  Skin:    General: Skin is warm and dry.  Neurological:     General: No focal deficit present.     Mental Status: He is alert and oriented to person, place, and time.     Cranial Nerves: Cranial nerves are intact. No cranial nerve deficit, dysarthria or facial asymmetry.     Sensory: Sensation is intact. No sensory deficit.     Motor: Motor function is intact. No weakness or abnormal muscle tone.     Coordination: Coordination is intact. Coordination normal. Finger-Nose-Finger Test normal.     Gait: Gait is intact. Gait normal.  Psychiatric:        Mood and Affect: Mood normal.        Behavior: Behavior normal.        Thought Content: Thought content normal.        Judgment: Judgment normal.    ED Results / Procedures / Treatments   Labs (all labs ordered are listed, but only abnormal results are displayed) Labs Reviewed  BASIC METABOLIC PANEL - Abnormal; Notable for the following components:      Result Value   Sodium 133 (*)    Chloride 97 (*)    Glucose, Bld 108 (*)    All other components within normal limits  URINALYSIS, ROUTINE W REFLEX MICROSCOPIC    EKG EKG Interpretation  Date/Time:  Wednesday January 15 2021 15:38:15 EDT Ventricular Rate:  89 PR Interval:  128 QRS Duration: 82 QT Interval:  347 QTC Calculation: 423 R  Axis:   -49 Text Interpretation: Sinus rhythm Left anterior fascicular block Abnormal R-wave progression, late transition Confirmed by [id number] [phone]) on 01/15/2021 3:55:30 PM  Radiology DG Chest Portable 1 View  Result Date: 01/15/2021 CLINICAL DATA:  Hypertension EXAM: PORTABLE CHEST 1 VIEW COMPARISON:  04/26/2020 FINDINGS: Heart and mediastinal contours are within normal limits. No focal opacities or effusions. No acute bony abnormality. IMPRESSION: No active disease. Electronically Signed  By: Charlett Nose M.D.   On: 01/15/2021 15:44    Procedures Procedures   Medications Ordered in ED Medications  cloNIDine (CATAPRES) tablet 0.3 mg (0.3 mg Oral Given 01/15/21 1558)  lactated ringers bolus 1,000 mL (0 mLs Intravenous Stopped 01/15/21 1901)    ED Course  I have reviewed the triage vital signs and the nursing notes.  Pertinent labs & imaging results that were available during my care of the patient were reviewed by me and considered in my medical decision making (see chart for details).    MDM Rules/Calculators/A&P                           Patient presents for hypertension.  This was discovered at home, checking his own blood pressure.  He denies any current or prior symptoms.  Upon being bedded in the ED, pressures in the range of 170s over 110s.  Physical exam is reassuring.  Patient was noted to be urinating frequently while in the ED, which I suspect is from his first dose of HCTZ prior to arrival.  I discussed with the patient the short-term and long-term risks of hypertension.  Patient stated that he would like to undergo ED work-up to assess for endorgan damage.  His last doses of amlodipine and losartan were at 3 AM.  Initially, I ordered these home medications.  Patient stated that he would prefer to have his dose of clonidine, which he was taking 2 weeks ago.  He states that this is the only thing that has worked to control his blood pressure in the past.  He was given his  previous dose of 0.3 mg clonidine.  Patient's work-up showed no evidence of endorgan damage.  On reassessment, patient continues to have frequent urination.  Blood pressures were reduced significantly.  I checked for orthostatics and, when standing, patient's heart rate went up into the 120s and his blood pressures dropped into the 90s over 60s.  Patient was given bolus of IV fluids.  On further reassessment, patient's blood pressure normalized in the high-normal range.  Orthostatic hypotension resolved.  He was instructed to call his primary care doctor first thing tomorrow to discuss home blood pressure regimen.  He was discharged in good condition.  Final Clinical Impression(s) / ED Diagnoses Final diagnoses:  Hypertension, unspecified type    Rx / DC Orders ED Discharge Orders     None        Gloris Manchester, MD 01/16/21 0050

## 2021-01-16 ENCOUNTER — Encounter (HOSPITAL_BASED_OUTPATIENT_CLINIC_OR_DEPARTMENT_OTHER): Payer: Self-pay | Admitting: *Deleted

## 2021-01-16 ENCOUNTER — Emergency Department (HOSPITAL_BASED_OUTPATIENT_CLINIC_OR_DEPARTMENT_OTHER)
Admission: EM | Admit: 2021-01-16 | Discharge: 2021-01-17 | Disposition: A | Discharge status: Home / Self Care | Payer: BC Managed Care – PPO | Attending: Emergency Medicine | Admitting: Emergency Medicine

## 2021-01-16 ENCOUNTER — Other Ambulatory Visit: Payer: Self-pay

## 2021-01-16 DIAGNOSIS — I10 Essential (primary) hypertension: Secondary | ICD-10-CM | POA: Diagnosis not present

## 2021-01-16 DIAGNOSIS — Z79899 Other long term (current) drug therapy: Secondary | ICD-10-CM | POA: Diagnosis not present

## 2021-01-16 DIAGNOSIS — I959 Hypotension, unspecified: Secondary | ICD-10-CM | POA: Insufficient documentation

## 2021-01-16 NOTE — ED Triage Notes (Signed)
He was here yesterday for HTN. He is here today for hypotension. No symptoms. He was seen at [facility] prior to coming here.

## 2021-01-17 NOTE — ED Provider Notes (Signed)
[facility] EMERGENCY DEPARTMENT Provider Note   CSN: [id number] Arrival date & time: 01/16/21  2244     History Chief Complaint  Patient presents with   Hypotension    Harry Spears is a 60 y.o. male.  The history is provided by the patient and medical records.  Harry Spears is a 60 y.o. male who presents to the Emergency Department complaining of hypotension.  He presents to the ED for evaluation of hypotension. Yesterday he was seen in the ED for HTN and was treated with clonidine and his blood pressure dropped significantly and he was treated with IVF.  Since discharge he has checked his BP at home and his BP has been running 90s/60s on several readings followed by one teens systolic.[name] When his blood pressure was in the 90s he felt a little weak. Earlier today went to [facility] and was discharged. He presents to this department for repeat assessment..    He has significant anxiety over his BP.    Has urinary frequency.  No HA, CP, SOB, AP, N/V, vision changes.     He has been on losartan for hypertension. One month ago he was taken off of clonidine and started on amlodipine (with losartan continued). Yesterday he discontinued the amlodipine and was switched to clonidine .3 mg BID. His last dose of clonidine was at 3 PM.    Past Medical History:  Diagnosis Date   Hypertension     There are no problems to display for this patient.   History reviewed. No pertinent surgical history.     No family history on file.  Social History   Tobacco Use   Smoking status: Never   Smokeless tobacco: Never  Vaping Use   Vaping Use: Never used  Substance Use Topics   Alcohol use: Never   Drug use: Never    Home Medications Prior to Admission medications   Medication Sig Start Date End Date Taking? Authorizing Provider  losartan (COZAAR) 100 MG tablet Take 100 mg by mouth daily. 12/29/19  Yes [provider]  rosuvastatin (CRESTOR) 10 MG tablet Take 10 mg by mouth  daily.   Yes [provider]  zolpidem (AMBIEN) 10 MG tablet Take 10 mg by mouth at bedtime as needed for sleep.   Yes [provider]  lidocaine (LIDODERM) 5 % Place 1 patch onto the skin daily. Remove & Discard patch within 12 hours or as directed by MD 12/09/19   Nicanor Alcon, April, MD  naproxen (NAPROSYN) 375 MG tablet Take 1 tablet (375 mg total) by mouth 2 (two) times daily. 12/09/19   Palumbo, April, MD  pravastatin (PRAVACHOL) 40 MG tablet Take by mouth. 08/06/16   [provider]    Allergies    Patient has no known allergies.  Review of Systems   Review of Systems  All other systems reviewed and are negative.  Physical Exam Updated Vital Signs BP 126/86   Pulse 78   Temp 98.1 F (36.7 C) (Oral)   Resp 16   Ht 5\' 10"  (1.778 m)   Wt 89.8 kg   SpO2 100%   BMI 28.41 kg/m   Physical Exam Vitals and nursing note reviewed.  Constitutional:      Appearance: He is well-developed.  HENT:     Head: Normocephalic and atraumatic.  Cardiovascular:     Rate and Rhythm: Normal rate and regular rhythm.     Heart sounds: No murmur heard. Pulmonary:     Effort: Pulmonary effort  is normal. No respiratory distress.     Breath sounds: Normal breath sounds.  Abdominal:     Palpations: Abdomen is soft.     Tenderness: There is no abdominal tenderness. There is no guarding or rebound.  Musculoskeletal:        General: No swelling or tenderness.  Skin:    General: Skin is warm and dry.  Neurological:     Mental Status: He is alert and oriented to person, place, and time.  Psychiatric:        Behavior: Behavior normal.    ED Results / Procedures / Treatments   Labs (all labs ordered are listed, but only abnormal results are displayed) Labs Reviewed - No data to display  EKG None  Radiology DG Chest Portable 1 View  Result Date: 01/15/2021 CLINICAL DATA:  Hypertension EXAM: PORTABLE CHEST 1 VIEW COMPARISON:  04/26/2020 FINDINGS: Heart and mediastinal  contours are within normal limits. No focal opacities or effusions. No acute bony abnormality. IMPRESSION: No active disease. Electronically Signed   By: Charlett Nose M.D.   On: 01/15/2021 15:44    Procedures Procedures   Medications Ordered in ED Medications - No data to display  ED Course  I have reviewed the triage vital signs and the nursing notes.  Pertinent labs & imaging results that were available during my care of the patient were reviewed by me and considered in my medical decision making (see chart for details).    MDM Rules/Calculators/A&P                          patient here for evaluation of hypotensive episode earlier today, blood pressure readings since that time have been in the one teens systolic. He is asymptomatic on ED assessment. He does have urinary frequency and had a UA performed yesterday, which is not consistent with UTI. BMP performed yesterday also with normal renal function. Presentation is not consistent with sepsis. Discussed with patient recommendation to discontinue his clonidine at this time. He may consider restarting the amlodipine versus of following up with his family doctor regarding further blood pressure medication adjustments.  Final Clinical Impression(s) / ED Diagnoses Final diagnoses:  Hypotensive episode    Rx / DC Orders ED Discharge Orders     None        Tilden Fossa, MD 01/17/21 0225

## 2021-01-17 NOTE — Discharge Instructions (Signed)
Stop taking your clonidine.  Drink plenty of fluids.

## 2021-01-22 ENCOUNTER — Other Ambulatory Visit: Payer: Self-pay | Admitting: Family Medicine

## 2021-01-22 DIAGNOSIS — R972 Elevated prostate specific antigen [PSA]: Secondary | ICD-10-CM

## 2021-02-06 ENCOUNTER — Other Ambulatory Visit: Payer: Self-pay

## 2021-02-06 ENCOUNTER — Ambulatory Visit
Admission: RE | Admit: 2021-02-06 | Discharge: 2021-02-06 | Disposition: A | Discharge status: Home / Self Care | Payer: BC Managed Care – PPO | Source: Ambulatory Visit | Attending: Family Medicine | Admitting: Family Medicine

## 2021-02-06 DIAGNOSIS — R972 Elevated prostate specific antigen [PSA]: Secondary | ICD-10-CM

## 2021-02-06 MED ORDER — GADOBENATE DIMEGLUMINE 529 MG/ML IV SOLN
19.0000 mL | Freq: Once | INTRAVENOUS | Status: AC | PRN
  Administered 2021-02-06: 19 mL via INTRAVENOUS

## 2021-02-08 ENCOUNTER — Other Ambulatory Visit: Payer: BC Managed Care – PPO

## 2021-07-03 ENCOUNTER — Other Ambulatory Visit: Payer: Self-pay

## 2021-07-03 ENCOUNTER — Emergency Department (HOSPITAL_BASED_OUTPATIENT_CLINIC_OR_DEPARTMENT_OTHER)
Admission: EM | Admit: 2021-07-03 | Discharge: 2021-07-03 | Disposition: A | Discharge status: Home / Self Care | Payer: BC Managed Care – PPO | Attending: Emergency Medicine | Admitting: Emergency Medicine

## 2021-07-03 ENCOUNTER — Emergency Department (HOSPITAL_BASED_OUTPATIENT_CLINIC_OR_DEPARTMENT_OTHER): Payer: BC Managed Care – PPO

## 2021-07-03 ENCOUNTER — Encounter (HOSPITAL_BASED_OUTPATIENT_CLINIC_OR_DEPARTMENT_OTHER): Payer: Self-pay | Admitting: Emergency Medicine

## 2021-07-03 DIAGNOSIS — R140 Abdominal distension (gaseous): Secondary | ICD-10-CM | POA: Insufficient documentation

## 2021-07-03 DIAGNOSIS — R109 Unspecified abdominal pain: Secondary | ICD-10-CM | POA: Insufficient documentation

## 2021-07-03 DIAGNOSIS — Z79899 Other long term (current) drug therapy: Secondary | ICD-10-CM | POA: Diagnosis not present

## 2021-07-03 DIAGNOSIS — I10 Essential (primary) hypertension: Secondary | ICD-10-CM | POA: Diagnosis not present

## 2021-07-03 HISTORY — DX: Gastro-esophageal reflux disease without esophagitis: K21.9

## 2021-07-03 LAB — CBC WITH DIFFERENTIAL/PLATELET
Abs Immature Granulocytes: 0.01 10*3/uL (ref 0.00–0.07)
Basophils Absolute: 0 10*3/uL (ref 0.0–0.1)
Basophils Relative: 1 %
Eosinophils Absolute: 0 10*3/uL (ref 0.0–0.5)
Eosinophils Relative: 1 %
HCT: 46.2 % (ref 39.0–52.0)
Hemoglobin: 15.3 g/dL (ref 13.0–17.0)
Immature Granulocytes: 0 %
Lymphocytes Relative: 36 %
Lymphs Abs: 2.1 10*3/uL (ref 0.7–4.0)
MCH: 29.5 pg (ref 26.0–34.0)
MCHC: 33.1 g/dL (ref 30.0–36.0)
MCV: 89.2 fL (ref 80.0–100.0)
Monocytes Absolute: 0.5 10*3/uL (ref 0.1–1.0)
Monocytes Relative: 10 %
Neutro Abs: 3 10*3/uL (ref 1.7–7.7)
Neutrophils Relative %: 52 %
Platelets: 160 10*3/uL (ref 150–400)
RBC: 5.18 MIL/uL (ref 4.22–5.81)
RDW: 12.7 % (ref 11.5–15.5)
WBC: 5.7 10*3/uL (ref 4.0–10.5)
nRBC: 0 % (ref 0.0–0.2)

## 2021-07-03 LAB — URINALYSIS, ROUTINE W REFLEX MICROSCOPIC
Bilirubin Urine: NEGATIVE
Glucose, UA: NEGATIVE mg/dL
Hgb urine dipstick: NEGATIVE
Ketones, ur: NEGATIVE mg/dL
Leukocytes,Ua: NEGATIVE
Nitrite: NEGATIVE
Protein, ur: NEGATIVE mg/dL
Specific Gravity, Urine: >=1.03 (ref 1.005–1.030)
pH: 5.5 (ref 5.0–8.0)

## 2021-07-03 LAB — COMPREHENSIVE METABOLIC PANEL
ALT: 27 U/L (ref 0–44)
AST: 28 U/L (ref 15–41)
Albumin: 4 g/dL (ref 3.5–5.0)
Alkaline Phosphatase: 55 U/L (ref 38–126)
Anion gap: 7 (ref 5–15)
BUN: 21 mg/dL — ABNORMAL HIGH (ref 6–20)
CO2: 28 mmol/L (ref 22–32)
Calcium: 9.2 mg/dL (ref 8.9–10.3)
Chloride: 104 mmol/L (ref 98–111)
Creatinine, Ser: 1.44 mg/dL — ABNORMAL HIGH (ref 0.61–1.24)
GFR, Estimated: 56 mL/min — ABNORMAL LOW (ref >60)
Glucose, Bld: 108 mg/dL — ABNORMAL HIGH (ref 70–99)
Potassium: 4.4 mmol/L (ref 3.5–5.1)
Sodium: 139 mmol/L (ref 135–145)
Total Bilirubin: 0.8 mg/dL (ref 0.3–1.2)
Total Protein: 7.3 g/dL (ref 6.5–8.1)

## 2021-07-03 LAB — LIPASE, BLOOD: Lipase: 41 U/L (ref 11–51)

## 2021-07-03 MED ORDER — IOHEXOL 300 MG/ML  SOLN
100.0000 mL | Freq: Once | INTRAMUSCULAR | Status: AC | PRN
  Administered 2021-07-03: 100 mL via INTRAVENOUS

## 2021-07-03 NOTE — ED Triage Notes (Signed)
States has been been bloated for the last couple weeks, denies abd pain. Denies urinary problems , last BM last night. Is scheduled for a physical with PCP in 2 weeks  ?

## 2021-07-03 NOTE — Discharge Instructions (Signed)
Work-up today very reassuring.  Labs normal.  CT scan of abdomen and pelvis without any acute findings.  Keep your appointment to follow-up with your primary care doctor.  Return for any new or worse symptoms ?

## 2021-07-03 NOTE — ED Provider Notes (Signed)
?[facility] EMERGENCY DEPARTMENT ?Provider Note ? ? ?CSN: [id number] ?Arrival date & time: 07/03/21  0706 ? ?  ? ?History ? ?Chief Complaint  ?Patient presents with  ? Abd bloating  ? ? ?Harry Spears is a 61 y.o. male. ? ?Patient with complaint abdominal distention with some mild discomfort for 2 weeks.  Has never had anything like this before.  Denies any fevers denies nausea vomiting diarrhea denies any dysuria.  Past medical history significant for hypertension.  Patient blood pressure high this morning.  But states he just took his blood pressure medicine.  Patient does have follow-up with his primary care doctor in 2 weeks but he has not seen his primary care doctor for a while. ? ? ?  ? ?Home Medications ?Prior to Admission medications   ?Medication Sig Start Date End Date Taking? Authorizing Provider  ?cloNIDine (CATAPRES) 0.3 MG tablet Take 0.3 mg by mouth 2 (two) times daily. 05/16/21  Yes [provider]  ?losartan (COZAAR) 100 MG tablet Take 100 mg by mouth daily. 12/29/19  Yes [provider]  ?rosuvastatin (CRESTOR) 10 MG tablet Take 10 mg by mouth daily.   Yes [provider]  ?zolpidem (AMBIEN) 10 MG tablet Take 10 mg by mouth at bedtime as needed for sleep.   Yes [provider]  ?lidocaine (LIDODERM) 5 % Place 1 patch onto the skin daily. Remove & Discard patch within 12 hours or as directed by MD 12/09/19   Nicanor Alcon, April, MD  ?naproxen (NAPROSYN) 375 MG tablet Take 1 tablet (375 mg total) by mouth 2 (two) times daily. 12/09/19   Palumbo, April, MD  ?pantoprazole (PROTONIX) 40 MG tablet Take 40 mg by mouth every morning. 03/10/21   [provider]  ?pravastatin (PRAVACHOL) 40 MG tablet Take by mouth. 08/06/16   [provider]  ?   ? ?Allergies    ?Patient has no known allergies.   ? ?Review of Systems   ?Review of Systems  ?Constitutional:  Negative for chills and fever.  ?HENT:  Negative for ear pain and sore throat.   ?Eyes:  Negative for pain  and visual disturbance.  ?Respiratory:  Negative for cough and shortness of breath.   ?Cardiovascular:  Negative for chest pain and palpitations.  ?Gastrointestinal:  Positive for abdominal distention and abdominal pain. Negative for vomiting.  ?Genitourinary:  Negative for dysuria and hematuria.  ?Musculoskeletal:  Negative for arthralgias and back pain.  ?Skin:  Negative for color change and rash.  ?Neurological:  Negative for seizures and syncope.  ?All other systems reviewed and are negative. ? ?Physical Exam ?Updated Vital Signs ?BP (!) 166/100 (BP Location: Right Arm)   Pulse 87   Temp 98.5 ?F (36.9 ?C) (Oral)   Resp 16   Ht 1.791 m (5' 10.5")   Wt 97.6 kg   SpO2 99%   BMI 30.44 kg/m?  ?Physical Exam ?Vitals and nursing note reviewed.  ?Constitutional:   ?   General: He is not in acute distress. ?   Appearance: Normal appearance. He is well-developed.  ?HENT:  ?   Head: Normocephalic and atraumatic.  ?Eyes:  ?   Extraocular Movements: Extraocular movements intact.  ?   Conjunctiva/sclera: Conjunctivae normal.  ?   Pupils: Pupils are equal, round, and reactive to light.  ?Cardiovascular:  ?   Rate and Rhythm: Normal rate and regular rhythm.  ?   Heart sounds: No murmur heard. ?Pulmonary:  ?   Effort: Pulmonary effort is normal. No  respiratory distress.  ?   Breath sounds: Normal breath sounds.  ?Abdominal:  ?   General: There is distension.  ?   Palpations: Abdomen is soft.  ?   Tenderness: There is no abdominal tenderness. There is no guarding.  ?Musculoskeletal:     ?   General: No swelling.  ?   Cervical back: Normal range of motion and neck supple.  ?Skin: ?   General: Skin is warm and dry.  ?   Capillary Refill: Capillary refill takes less than 2 seconds.  ?Neurological:  ?   General: No focal deficit present.  ?   Mental Status: He is alert and oriented to person, place, and time.  ?   Cranial Nerves: No cranial nerve deficit.  ?   Sensory: No sensory deficit.  ?   Motor: No weakness.   ?Psychiatric:     ?   Mood and Affect: Mood normal.  ? ? ?ED Results / Procedures / Treatments   ?Labs ?(all labs ordered are listed, but only abnormal results are displayed) ?Labs Reviewed  ?COMPREHENSIVE METABOLIC PANEL  ?CBC WITH DIFFERENTIAL/PLATELET  ?LIPASE, BLOOD  ?URINALYSIS, ROUTINE W REFLEX MICROSCOPIC  ? ? ?EKG ?None ? ?Radiology ?No results found. ? ?Procedures ?Procedures  ? ? ?Medications Ordered in ED ?Medications - No data to display ? ?ED Course/ Medical Decision Making/ A&P ?  ?                        ?Medical Decision Making ?Amount and/or Complexity of Data Reviewed ?Labs: ordered. ?Radiology: ordered. ? ?Risk ?Prescription drug management. ? ? ?Patient very concerned about the abdominal distention.  Had discussion with him just doing abdominal series.  But he is very concerned about some internal abdominal process and wants CT scan of the abdomen.  Does not have follow-up with his primary care doctor for 2 weeks. ? ?Work-up here complete metabolic panel normal renal function a little bit worse than baseline from September.  CBC normal no leukocytosis.  Hemoglobin normal.  Lipase is normal. ? ?CT scan of the abdomen also normal.  No evidence of any acute findings.  Which is making the patient feel much better. ? ?Urinalysis negative. ? ?We will have patient follow-up with primary care doctor. ? ? ?Final Clinical Impression(s) / ED Diagnoses ?Final diagnoses:  ?Abdominal distension  ? ? ?Rx / DC Orders ?ED Discharge Orders   ? ? None  ? ?  ? ? ?  ?Vanetta Mulders, MD ?07/03/21 0930 ? ?

## 2022-03-02 IMAGING — CT CT RENAL STONE PROTOCOL
2 of 4 series · 16 of 46 positions shown, 18 images · non-contrast
Comparison: 08/01/2017

CLINICAL DATA: Left flank pain

EXAM:
CT ABDOMEN AND PELVIS WITHOUT CONTRAST
TECHNIQUE: Multidetector CT imaging of the abdomen and pelvis was performed
following the standard protocol without IV contrast.

[Series 2: axial st · axial · 0.78mm/px · z∈[-327,+113]mm · 13 of 96 slices shown, 15 images]
[im 4/96  soft-tissue]
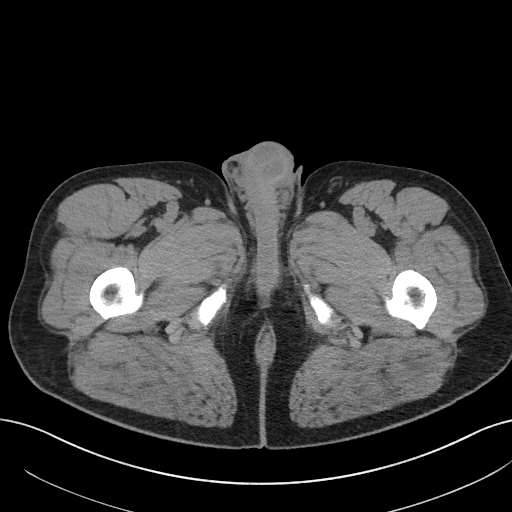
[im 4/96  bone]
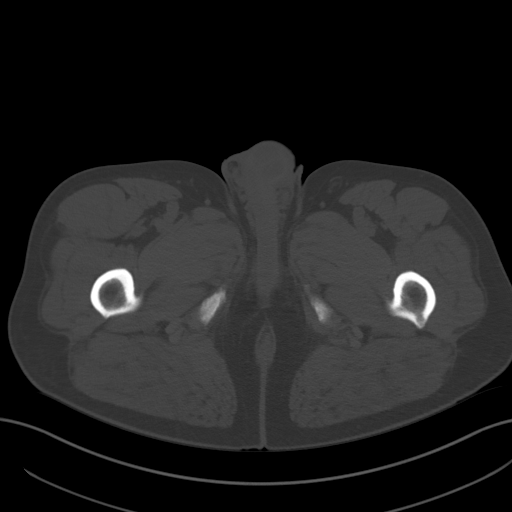
[im 12/96  soft-tissue]
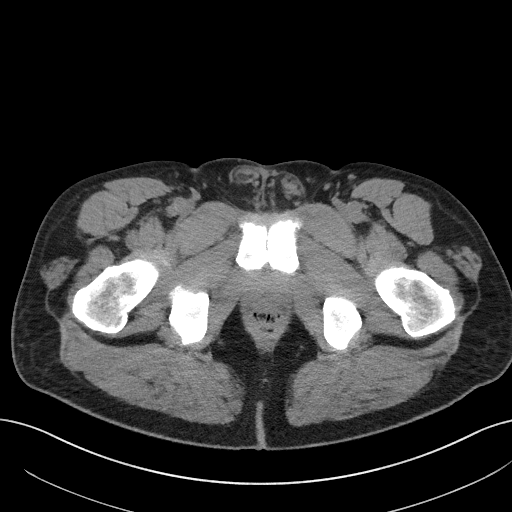
[im 20/96  soft-tissue]
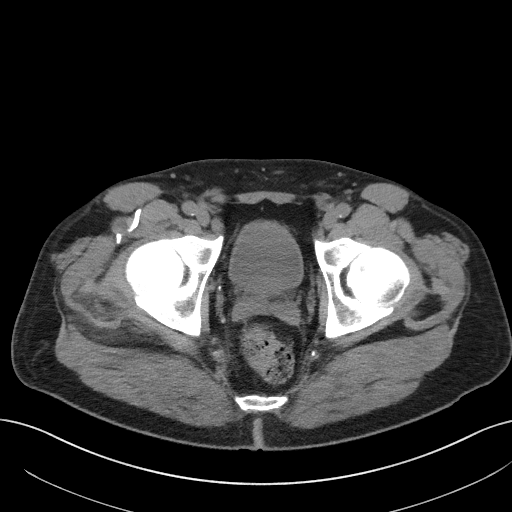
[im 27/96  soft-tissue]
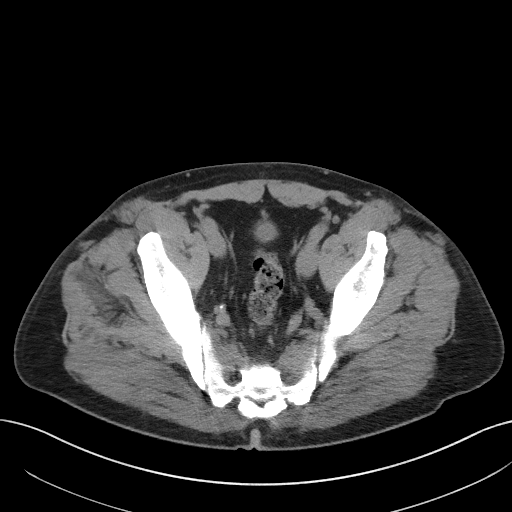
[im 35/96  soft-tissue]
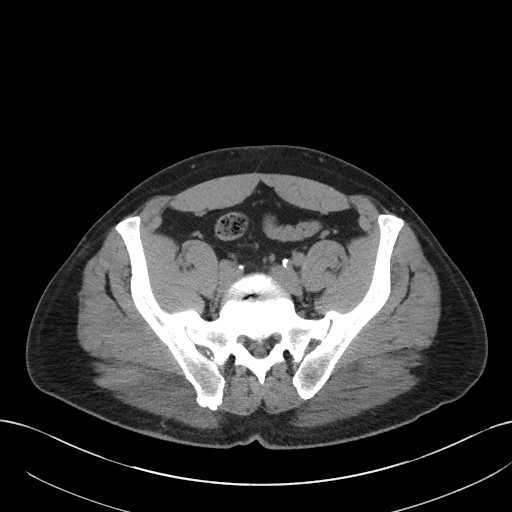
[im 42/96  soft-tissue]
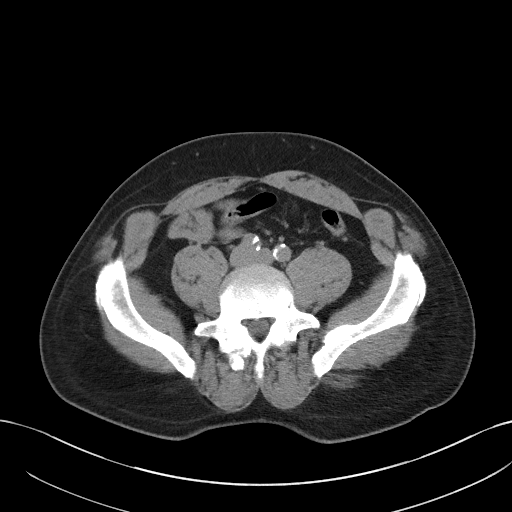
[im 50/96  soft-tissue]
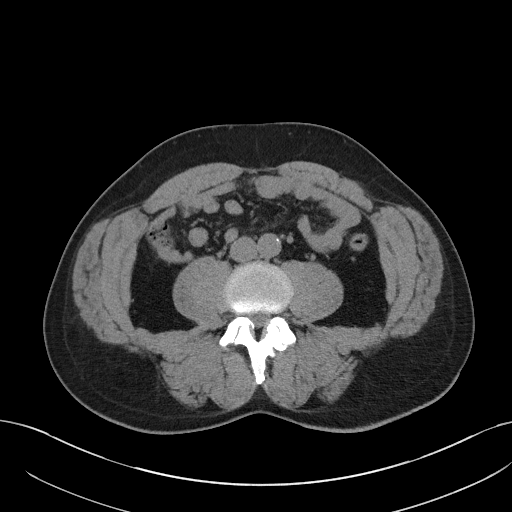
[im 54/96  soft-tissue]
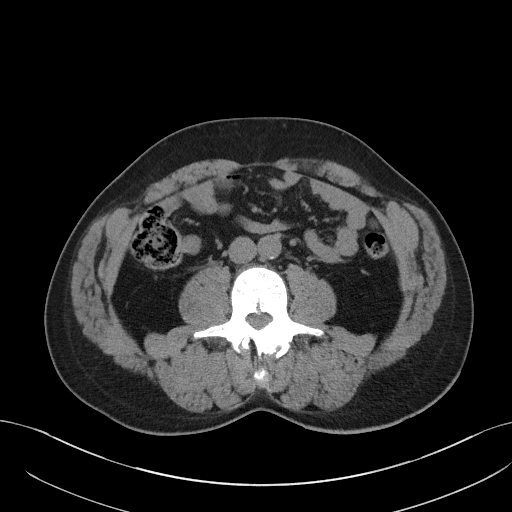
[im 61/96  soft-tissue]
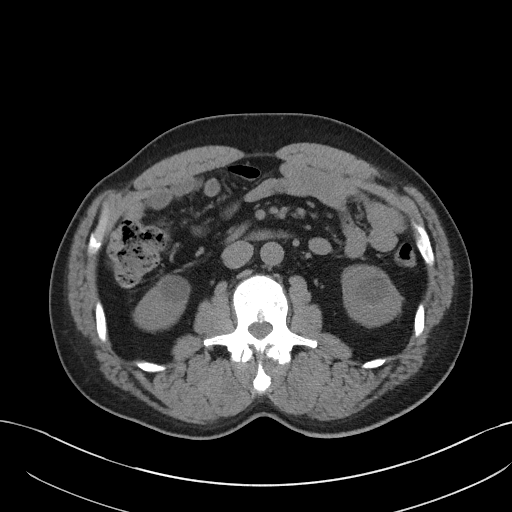
[im 61/96  bone]
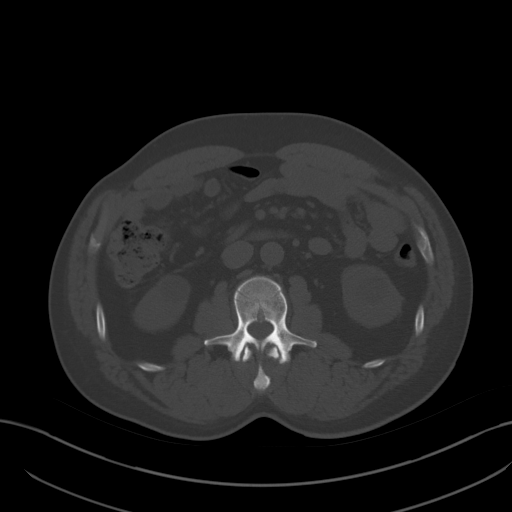
[im 69/96  soft-tissue]
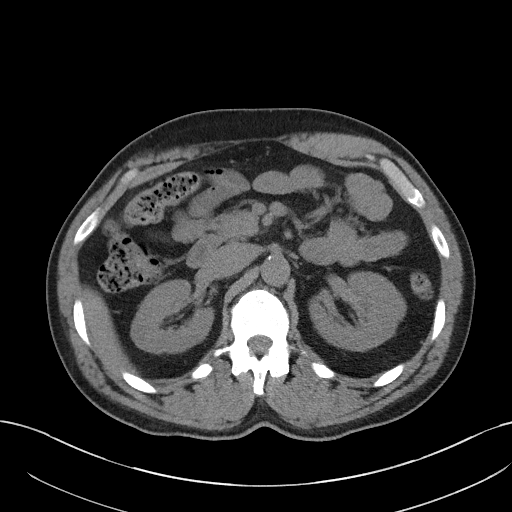
[im 77/96  soft-tissue]
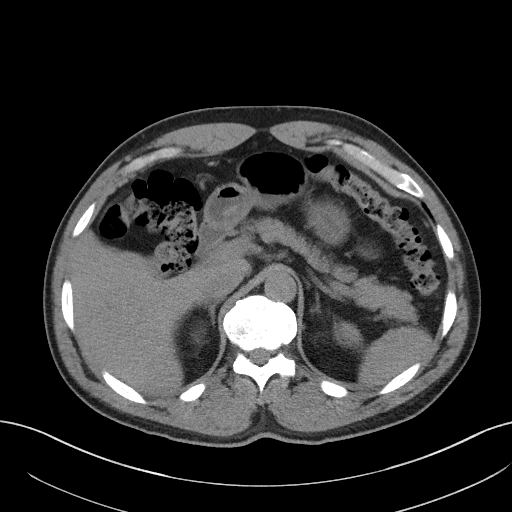
[im 84/96  soft-tissue]
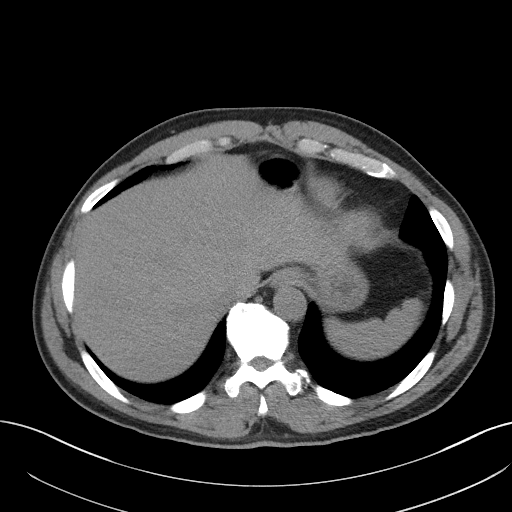
[im 92/96  soft-tissue]
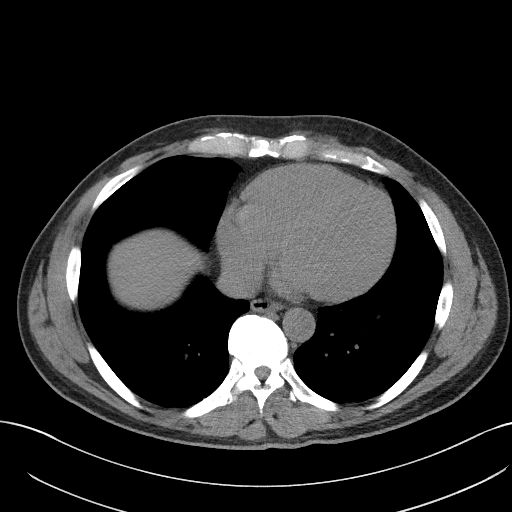

[Series 4: coronal st · coronal · 0.81mm/px · 3 of 85 slices shown]
[im 29/85  soft-tissue]
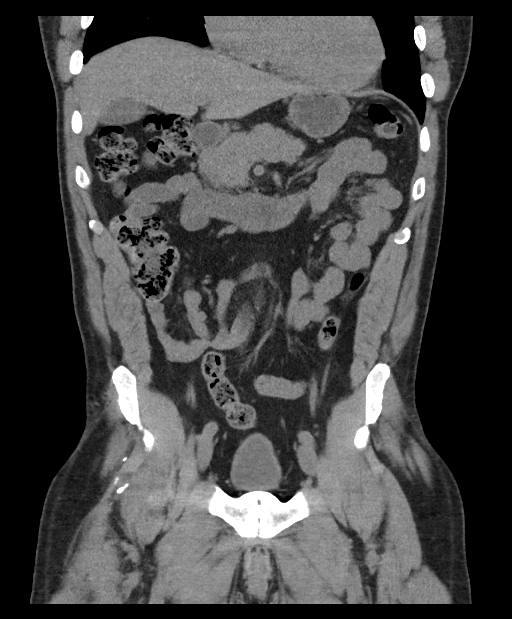
[im 38/85  soft-tissue]
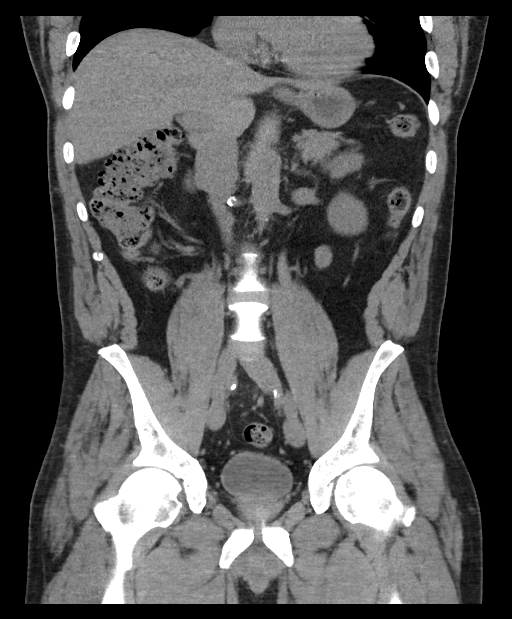
[im 47/85  soft-tissue]
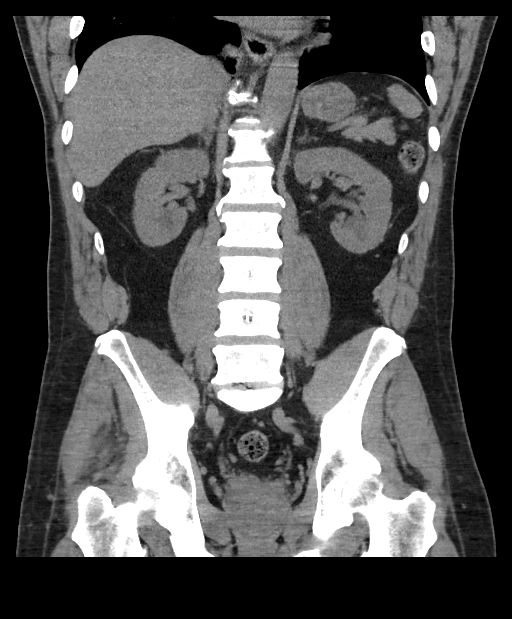

[16 of 46 positions shown; findings below may reference images not displayed]

FINDINGS: Lower chest: Lung bases are clear.

Hepatobiliary: Unenhanced liver is unremarkable.

Gallbladder is unremarkable. No intrahepatic or extrahepatic ductal
dilatation.

Pancreas: Within normal limits.

Spleen: Within normal limits.

Adrenals/Urinary Tract: Adrenal glands are within normal limits.

Bilateral renal cysts, measuring up to 2.2 cm in the right lower
pole and 2.4 cm in the left lower pole. No renal, ureteral, or
bladder calculi. No hydronephrosis.

Bladder is within normal limits.

Stomach/Bowel: Stomach is within normal limits.

No evidence of bowel obstruction.

Normal appendix (series 2/image 44).

Vascular/Lymphatic: No evidence of abdominal aortic aneurysm.

Atherosclerotic calcifications of the abdominal aorta and branch
vessels.

No suspicious abdominopelvic lymphadenopathy.

Reproductive: Mild prostatomegaly.

Other: No abdominopelvic ascites.

Musculoskeletal: Degenerative changes of the visualized
thoracolumbar spine.

Mild fatty muscular atrophy on the right (series 2/image 72),
chronic.
IMPRESSION: No renal, ureteral, or bladder calculi.  No hydronephrosis.

Bilateral renal cysts, measuring up to 2.4 cm on the left, benign.

## 2022-04-09 ENCOUNTER — Ambulatory Visit (INDEPENDENT_AMBULATORY_CARE_PROVIDER_SITE_OTHER): Payer: BC Managed Care – PPO | Admitting: Podiatry

## 2022-04-09 DIAGNOSIS — Z91199 Patient's noncompliance with other medical treatment and regimen due to unspecified reason: Secondary | ICD-10-CM

## 2022-04-09 NOTE — Progress Notes (Signed)
1. No-show for appointment     

## 2022-08-24 ENCOUNTER — Ambulatory Visit: Payer: BC Managed Care – PPO | Admitting: Podiatry

## 2022-08-24 DIAGNOSIS — L84 Corns and callosities: Secondary | ICD-10-CM

## 2022-08-24 MED ORDER — UREA 10 % EX CREA: TOPICAL_CREAM | CUTANEOUS | 0 refills | Status: AC | PRN

## 2022-08-24 NOTE — Progress Notes (Signed)
  Subjective:  Patient ID: Harry Spears, male    DOB: 1960-06-20,  MRN: [id number]  Chief Complaint  Patient presents with   Callouses    Bilateral feet, callouse located at the ball of foot, lateral side     62 y.o. male presents with the above complaint. History confirmed with patient. Patient presenting with pain related to callus that has built up on the plantar aspect of the fifth metatarsal head bilaterally.  Patient denies any history of diabetes or numbness in the foot.  Objective:  Physical Exam: warm, good capillary refill nail exam normal nails without lesions DP pulses palpable, PT pulses palpable, and protective sensation intact Left Foot:  Pain with palpation of hyperkeratotic lesion subfifth metatarsal head Right Foot: Pain with palpation of hyperkeratotic lesion subfifth metatarsal head head Assessment:   1. Pre-ulcerative calluses      Plan:  Patient was evaluated and treated and all questions answered.  #Hyperkeratotic lesions/pre ulcerative calluses present subfifth metatarsal head bilaterally All symptomatic hyperkeratoses x 2 separate lesions were safely debrided with a sterile #10 blade to patient's level of comfort without incident. We discussed preventative and palliative care of these lesions including supportive and accommodative shoegear, padding, prefabricated and custom molded accommodative orthoses, use of a pumice stone and lotions/creams daily.  Return if symptoms worsen or fail to improve.         Corinna Gab, DPM [facility] / [facility]

## 2023-02-09 ENCOUNTER — Other Ambulatory Visit: Payer: Self-pay | Admitting: Family Medicine

## 2023-02-09 DIAGNOSIS — R972 Elevated prostate specific antigen [PSA]: Secondary | ICD-10-CM

## 2023-03-18 ENCOUNTER — Encounter: Payer: Self-pay | Admitting: Family Medicine

## 2023-03-21 ENCOUNTER — Other Ambulatory Visit: Payer: BC Managed Care – PPO

## 2023-04-02 ENCOUNTER — Encounter (HOSPITAL_BASED_OUTPATIENT_CLINIC_OR_DEPARTMENT_OTHER): Payer: Self-pay | Admitting: Emergency Medicine

## 2023-04-02 ENCOUNTER — Other Ambulatory Visit: Payer: Self-pay

## 2023-04-02 ENCOUNTER — Emergency Department (HOSPITAL_BASED_OUTPATIENT_CLINIC_OR_DEPARTMENT_OTHER)
Admission: EM | Admit: 2023-04-02 | Discharge: 2023-04-02 | Disposition: A | Discharge status: Home / Self Care | Payer: BC Managed Care – PPO | Attending: Emergency Medicine | Admitting: Emergency Medicine

## 2023-04-02 DIAGNOSIS — K5900 Constipation, unspecified: Secondary | ICD-10-CM | POA: Insufficient documentation

## 2023-04-02 HISTORY — DX: Irritable bowel syndrome, unspecified: K58.9

## 2023-04-02 NOTE — ED Triage Notes (Signed)
Pt reports 1 small BM x 4-5 days. He denies pain. Reports he was dx with IBS and has seen a GI specialist. He has taken OTC meds without significant relief.

## 2023-04-02 NOTE — ED Provider Notes (Signed)
[place] EMERGENCY DEPARTMENT AT [facility]  Provider Note  CSN: [id number] Arrival date & time: 04/02/23 0418  History Chief Complaint  Patient presents with   Constipation    Harry Spears is a 62 y.o. male reports 4-5 days of constipation, some small stools but not as often as he would expect. He has seen PCP and GI recently. Took one dose of Miralax and another OTC laxative without results. Denies any abdominal pain, vomiting, fever, cramping or dysuria.    Home Medications Prior to Admission medications   Medication Sig Start Date End Date Taking? Authorizing Provider  cloNIDine (CATAPRES) 0.3 MG tablet Take 0.3 mg by mouth 2 (two) times daily. 05/16/21   [provider]  lidocaine (LIDODERM) 5 % Place 1 patch onto the skin daily. Remove & Discard patch within 12 hours or as directed by MD 12/09/19   Nicanor Alcon, April, MD  losartan (COZAAR) 100 MG tablet Take 100 mg by mouth daily. 12/29/19   [provider]  naproxen (NAPROSYN) 375 MG tablet Take 1 tablet (375 mg total) by mouth 2 (two) times daily. 12/09/19   Palumbo, April, MD  pantoprazole (PROTONIX) 40 MG tablet Take 40 mg by mouth every morning. 03/10/21   [provider]  pravastatin (PRAVACHOL) 40 MG tablet Take by mouth. 08/06/16   [provider]  rosuvastatin (CRESTOR) 10 MG tablet Take 10 mg by mouth daily.    [provider]  urea (CARMOL) 10 % cream Apply topically as needed. 08/24/22   Standiford, Jenelle Mages, DPM  zolpidem (AMBIEN) 10 MG tablet Take 10 mg by mouth at bedtime as needed for sleep.    [provider]     Allergies    Patient has no known allergies.   Review of Systems   Review of Systems Please see HPI for pertinent positives and negatives  Physical Exam BP 127/89   Pulse 72   Temp 98 F (36.7 C)   Resp 20   Ht 5' 10.5" (1.791 m)   Wt 90.7 kg   SpO2 92%   BMI 28.29 kg/m   Physical Exam Vitals and nursing note reviewed. Exam  conducted with a chaperone present.  Constitutional:      Appearance: Normal appearance.  HENT:     Head: Normocephalic and atraumatic.     Nose: Nose normal.     Mouth/Throat:     Mouth: Mucous membranes are moist.  Eyes:     Extraocular Movements: Extraocular movements intact.     Conjunctiva/sclera: Conjunctivae normal.  Cardiovascular:     Rate and Rhythm: Normal rate.  Pulmonary:     Effort: Pulmonary effort is normal.     Breath sounds: Normal breath sounds.  Abdominal:     General: Abdomen is flat.     Palpations: Abdomen is soft.     Tenderness: There is no abdominal tenderness.  Genitourinary:    Comments: Normal rectum, no impaction, moderately enlarged but non-tender prostate Musculoskeletal:        General: No swelling. Normal range of motion.     Cervical back: Neck supple.  Skin:    General: Skin is warm and dry.  Neurological:     General: No focal deficit present.     Mental Status: He is alert.  Psychiatric:        Mood and Affect: Mood normal.     ED Results / Procedures / Treatments   EKG None  Procedures Procedures  Medications Ordered in  the ED Medications - No data to display  Initial Impression and Plan  Patient here for uncomplicated constipation, no fecal impaction. Abdomen is benign, no indication for lab or imaging studies tonight. Recommend he increase his bowel regimen, drink plenty of fluids, high fiber diet. Follow up with GI if not improving in 2-3 days.   ED Course       MDM Rules/Calculators/A&P Medical Decision Making Problems Addressed: Constipation, unspecified constipation type: acute illness or injury  Risk OTC drugs.     Final Clinical Impression(s) / ED Diagnoses Final diagnoses:  Constipation, unspecified constipation type    Rx / DC Orders ED Discharge Orders     None        Pollyann Savoy, MD 04/02/23 [phone]

## 2024-01-07 ENCOUNTER — Emergency Department (HOSPITAL_BASED_OUTPATIENT_CLINIC_OR_DEPARTMENT_OTHER)
Admission: EM | Admit: 2024-01-07 | Discharge: 2024-01-07 | Disposition: A | Discharge status: Home / Self Care | Attending: Emergency Medicine | Admitting: Emergency Medicine

## 2024-01-07 ENCOUNTER — Encounter (HOSPITAL_BASED_OUTPATIENT_CLINIC_OR_DEPARTMENT_OTHER): Payer: Self-pay | Admitting: Urology

## 2024-01-07 ENCOUNTER — Other Ambulatory Visit: Payer: Self-pay

## 2024-01-07 DIAGNOSIS — R350 Frequency of micturition: Secondary | ICD-10-CM | POA: Insufficient documentation

## 2024-01-07 LAB — URINALYSIS, ROUTINE W REFLEX MICROSCOPIC
Bilirubin Urine: NEGATIVE
Glucose, UA: NEGATIVE mg/dL
Hgb urine dipstick: NEGATIVE
Ketones, ur: NEGATIVE mg/dL
Leukocytes,Ua: NEGATIVE
Nitrite: NEGATIVE
Protein, ur: NEGATIVE mg/dL
Specific Gravity, Urine: >=1.03 (ref 1.005–1.030)
pH: 5.5 (ref 5.0–8.0)

## 2024-01-07 NOTE — ED Notes (Signed)
Pt called for in lobby x 2 with no answer

## 2024-01-07 NOTE — ED Triage Notes (Signed)
 Pt states was seen last week in hospital in [place] States enlarged prostate and reports continued urinary frequency Denies pain  Has prostates biopsy scheduled Monday at [facility]

## 2024-01-07 NOTE — Discharge Instructions (Signed)
 Contact a health care provider if: You start peeing more than before. You feel pain when you pee. You notice blood in your pee. Your pee looks cloudy. You have a fever or chills. You throw up or you feel like you may throw up. Get help right away if: You can't pee.

## 2024-01-07 NOTE — ED Provider Notes (Signed)
 [place] [facility] AT [facility] Provider Note   CSN: [id number] Arrival date & time: 01/07/24  1433     Patient presents with: Urinary Frequency   Harry Spears is a 63 y.o. male who presents emergency department for chief complaint of urinary frequency.  Patient reports that he has a known history of BPH he is on Flomax also takes blood pressure medication including losartan  and clonidine .  He gets his PSA checked every 6 months and has had waxing and waning results and is scheduled for a prostate biopsy next week.  He denies nocturia hesitancy, or difficulty starting his urination.  He denies fever chills suprapubic pain or flank pain.  He does drink about 70 ounces of water daily.  He had labs drawn last week including a urine osmolality which were all within normal limits he has no evidence of diabetes     Urinary Frequency       Prior to Admission medications   Medication Sig Start Date End Date Taking? Authorizing Provider  cloNIDine  (CATAPRES ) 0.3 MG tablet Take 0.3 mg by mouth 2 (two) times daily. 05/16/21   [provider]  lidocaine  (LIDODERM ) 5 % Place 1 patch onto the skin daily. Remove & Discard patch within 12 hours or as directed by MD 12/09/19   Nettie, April, MD  losartan  (COZAAR ) 100 MG tablet Take 100 mg by mouth daily. 12/29/19   [provider]  naproxen  (NAPROSYN ) 375 MG tablet Take 1 tablet (375 mg total) by mouth 2 (two) times daily. 12/09/19   Palumbo, April, MD  pantoprazole (PROTONIX) 40 MG tablet Take 40 mg by mouth every morning. 03/10/21   [provider]  pravastatin (PRAVACHOL) 40 MG tablet Take by mouth. 08/06/16   [provider]  rosuvastatin (CRESTOR) 10 MG tablet Take 10 mg by mouth daily.    [provider]  urea  (CARMOL) 10 % cream Apply topically as needed. 08/24/22   Standiford, Marsa FALCON, DPM  zolpidem (AMBIEN) 10 MG tablet Take 10 mg by mouth at bedtime as needed for sleep.     [provider]    Allergies: Patient has no known allergies.    Review of Systems  Genitourinary:  Positive for frequency.    Updated Vital Signs BP (!) 158/107   Pulse 83   Temp 98.4 F (36.9 C)   Resp 20   Ht 5' 11 (1.803 m)   Wt 90.7 kg   SpO2 97%   BMI 27.89 kg/m   Physical Exam Vitals and nursing note reviewed.  Constitutional:      General: He is not in acute distress.    Appearance: He is well-developed. He is not diaphoretic.  HENT:     Head: Normocephalic and atraumatic.  Eyes:     General: No scleral icterus.    Conjunctiva/sclera: Conjunctivae normal.  Cardiovascular:     Rate and Rhythm: Normal rate and regular rhythm.     Heart sounds: Normal heart sounds.  Pulmonary:     Effort: Pulmonary effort is normal. No respiratory distress.     Breath sounds: Normal breath sounds.  Abdominal:     Palpations: Abdomen is soft.     Tenderness: There is no abdominal tenderness.  Musculoskeletal:     Cervical back: Normal range of motion and neck supple.  Skin:    General: Skin is warm and dry.  Neurological:     Mental Status: He is alert.  Psychiatric:  Behavior: Behavior normal.     (all labs ordered are listed, but only abnormal results are displayed) Labs Reviewed  URINALYSIS, ROUTINE W REFLEX MICROSCOPIC    EKG: None  Radiology: No results found.   Procedures   Medications Ordered in the ED - No data to display                                  Medical Decision Making Amount and/or Complexity of Data Reviewed Labs: ordered.   Patient with urinary frequency, recent labs reviewed from outside records, all within normal limits.  Patient's urine today shows no evidence of urinary tract infection.  I also got a postvoid residual urine bladder scan which had no retained urine.  At this time patient's symptoms do not seem to be secondary to incomplete bladder emptying or infection and feel that he is safe to follow closely with  his primary care physician.  We went over multiple reasons that he could be feeling this include the amount of water he is drinking daily.  Patient appears otherwise appropriate for discharge at this time.     Final diagnoses:  None    ED Discharge Orders     None          Arloa Chroman, PA-C 01/07/24 1734    Long, Fonda MATSU, MD 01/10/24 [phone]

## 2024-01-17 ENCOUNTER — Encounter (HOSPITAL_BASED_OUTPATIENT_CLINIC_OR_DEPARTMENT_OTHER): Payer: Self-pay | Admitting: Emergency Medicine

## 2024-01-17 ENCOUNTER — Other Ambulatory Visit: Payer: Self-pay

## 2024-01-17 ENCOUNTER — Emergency Department (HOSPITAL_BASED_OUTPATIENT_CLINIC_OR_DEPARTMENT_OTHER)
Admission: EM | Admit: 2024-01-17 | Discharge: 2024-01-17 | Disposition: A | Discharge status: Home / Self Care | Attending: Emergency Medicine | Admitting: Emergency Medicine

## 2024-01-17 DIAGNOSIS — I10 Essential (primary) hypertension: Secondary | ICD-10-CM | POA: Diagnosis not present

## 2024-01-17 DIAGNOSIS — R1030 Lower abdominal pain, unspecified: Secondary | ICD-10-CM | POA: Diagnosis not present

## 2024-01-17 DIAGNOSIS — Z79899 Other long term (current) drug therapy: Secondary | ICD-10-CM | POA: Insufficient documentation

## 2024-01-17 DIAGNOSIS — R140 Abdominal distension (gaseous): Secondary | ICD-10-CM | POA: Diagnosis not present

## 2024-01-17 DIAGNOSIS — R339 Retention of urine, unspecified: Secondary | ICD-10-CM | POA: Insufficient documentation

## 2024-01-17 LAB — URINALYSIS, ROUTINE W REFLEX MICROSCOPIC
Bilirubin Urine: NEGATIVE
Glucose, UA: NEGATIVE mg/dL
Ketones, ur: 15 mg/dL — AB
Leukocytes,Ua: NEGATIVE
Nitrite: NEGATIVE
Protein, ur: NEGATIVE mg/dL
Specific Gravity, Urine: 1.025 (ref 1.005–1.030)
pH: 5.5 (ref 5.0–8.0)

## 2024-01-17 LAB — URINALYSIS, MICROSCOPIC (REFLEX)

## 2024-01-17 NOTE — ED Provider Notes (Signed)
 [place] [facility] Provider Note   CSN: [id number] Arrival date & time: 01/17/24  1601     Patient presents with: Urinary Retention   Harry Spears is a 63 y.o. male.   HPI   63 year old male presents emergency department with concern for urinary retention.  States he had a prostate biopsy around a week ago that was negative for malignancy.  Has had intermittent bleeding since then.  Is on Flomax at baseline.  States that for the past few days, has only been dribbling.  Reports feeling of fullness in his bladder as well as some discomfort.  Saw his urologist earlier today who prescribed finasteride of which she took the first dose.  Given continued symptoms, prompted visit to the ED.  Denies any fevers, chills, nausea, vomiting, dysuria, change in bowel habits.  Past medical history significant for BPH, elevated PSA, hypertension, GERD, IBS  Prior to Admission medications   Medication Sig Start Date End Date Taking? Authorizing Provider  cloNIDine  (CATAPRES ) 0.3 MG tablet Take 0.3 mg by mouth 2 (two) times daily. 05/16/21   [provider]  lidocaine  (LIDODERM ) 5 % Place 1 patch onto the skin daily. Remove & Discard patch within 12 hours or as directed by MD 12/09/19   Nettie, April, MD  losartan  (COZAAR ) 100 MG tablet Take 100 mg by mouth daily. 12/29/19   [provider]  naproxen  (NAPROSYN ) 375 MG tablet Take 1 tablet (375 mg total) by mouth 2 (two) times daily. 12/09/19   Palumbo, April, MD  pantoprazole (PROTONIX) 40 MG tablet Take 40 mg by mouth every morning. 03/10/21   [provider]  pravastatin (PRAVACHOL) 40 MG tablet Take by mouth. 08/06/16   [provider]  rosuvastatin (CRESTOR) 10 MG tablet Take 10 mg by mouth daily.    [provider]  urea  (CARMOL) 10 % cream Apply topically as needed. 08/24/22   Standiford, Marsa FALCON, DPM  zolpidem (AMBIEN) 10 MG tablet Take 10 mg by mouth at bedtime as needed  for sleep.    [provider]    Allergies: Patient has no known allergies.    Review of Systems  All other systems reviewed and are negative.   Updated Vital Signs BP (!) 151/119   Pulse (!) 104   Temp 98.4 F (36.9 C)   Resp 20   Ht 5' 11 (1.803 m)   Wt 88.5 kg   SpO2 98%   BMI 27.20 kg/m   Physical Exam Vitals and nursing note reviewed.  Constitutional:      General: He is not in acute distress.    Appearance: He is well-developed.  HENT:     Head: Normocephalic and atraumatic.  Eyes:     Conjunctiva/sclera: Conjunctivae normal.  Cardiovascular:     Rate and Rhythm: Normal rate and regular rhythm.     Heart sounds: No murmur heard. Pulmonary:     Effort: Pulmonary effort is normal. No respiratory distress.     Breath sounds: Normal breath sounds.  Abdominal:     Palpations: Abdomen is soft.     Tenderness: There is abdominal tenderness. There is no right CVA tenderness or left CVA tenderness.     Comments: Tenderness suprapubic region.  Mild distention.  Musculoskeletal:        General: No swelling.     Cervical back: Neck supple.  Skin:    General: Skin is warm and dry.     Capillary Refill: Capillary refill  takes less than 2 seconds.  Neurological:     Mental Status: He is alert.  Psychiatric:        Mood and Affect: Mood normal.     (all labs ordered are listed, but only abnormal results are displayed) Labs Reviewed  URINALYSIS, ROUTINE W REFLEX MICROSCOPIC - Abnormal; Notable for the following components:      Result Value   Hgb urine dipstick LARGE (*)    Ketones, ur 15 (*)    All other components within normal limits  URINALYSIS, MICROSCOPIC (REFLEX) - Abnormal; Notable for the following components:   Bacteria, UA RARE (*)    All other components within normal limits  URINE CULTURE    EKG: None  Radiology: No results found.   Procedures   Medications Ordered in the ED - No data to display                                   Medical Decision Making Amount and/or Complexity of Data Reviewed Labs: ordered.   This patient presents to the ED for concern of urinary retention, this involves an extensive number of treatment options, and is a complaint that carries with it a high risk of complications and morbidity.  The differential diagnosis includes BPH, malignancy, stenting, medication side effect, cauda equina, other   Co morbidities that complicate the patient evaluation  See HPI   Additional history obtained:  Additional history obtained from EMR External records from outside source obtained and reviewed including hospital records   Lab Tests:  I Ordered, and personally interpreted labs.  The pertinent results include: UA rare bacteria, 21-50 RBCs, 15 ketones, large hemoglobin.  Urine culture pending.   Imaging Studies ordered:  Her scan showed greater than 350 cc in bladder.  Postvoid.   Cardiac Monitoring: / EKG:  N/a   Consultations Obtained:  N/a   Problem List / ED Course / Critical interventions / Medication management  Urinary retention Reevaluation of the patient showed that the patient stayed the same I have reviewed the patients home medicines and have made adjustments as needed   Social Determinants of Health:  Denies tobacco, licit drug use.   Test / Admission - Considered:  Urinary retention Vitals signs significant for hypertension. Otherwise within normal range and stable throughout visit. Laboratory studies significant for: See above  63 year old male presents emergency department with concern for urinary retention.  States he had a prostate biopsy around a week ago that was negative for malignancy.  Has had intermittent bleeding since then.  Is on Flomax at baseline.  States that for the past few days, has only been dribbling.  Reports feeling of fullness in his bladder as well as some discomfort.  Saw his urologist earlier today who prescribed finasteride of  which she took the first dose.  Given continued symptoms, prompted visit to the ED.  Denies any fevers, chills, nausea, vomiting, dysuria, change in bowel habits. On exam, tenderness suprapubic region with mild distention.  Bedside bladder scan showed greater than 350 cc of urine in bladder postvoid.  Foley catheter was passed by nursing staff with relief of symptoms.  Greater than 450 cc in bag at bedside.  Suspect most likely BPH/postsurgical prostate biopsy has most likely etiologies of patient's urinary retention.  No red flag signs concerning for cauda equina, other spinal cord impingement.  Will keep Foley catheter in place.  Patient already on Flomax  as well as finasteride.  Will recommend close follow-up with urology in the outpatient setting.  Patient educated regarding Foley catheter/bag care at home.  Treatment plan discussed with patient he acknowledged understanding was agreeable.  Patient well-appearing, afebrile in no acute distress. Worrisome signs and symptoms were discussed with the patient, and the patient acknowledged understanding to return to the ED if noticed. Patient was stable upon discharge.       Final diagnoses:  None    ED Discharge Orders     None          Silver Wonda LABOR, [place] 01/17/24 1744    Tegeler, Lonni PARAS, MD 01/17/24 [phone]

## 2024-01-17 NOTE — Discharge Instructions (Addendum)
 Recommend calling your urologist to schedule an appointment sooner than your 1 3 weeks out.  Continue take the Flomax as well as the finasteride.  Please not hesitate to return if you develop any sign or symptom we discussed.

## 2024-01-17 NOTE — ED Triage Notes (Addendum)
 Pt reports having a prostate biopsy last Mon, started having polyuria on Sat and then on Sun felt the urge to urinate but having retention, reports bladder pressure and hematuria  Denies hx of kidney stones and denies testicular pain

## 2024-01-18 LAB — URINE CULTURE: Culture: NO GROWTH

## 2024-01-23 ENCOUNTER — Emergency Department (HOSPITAL_BASED_OUTPATIENT_CLINIC_OR_DEPARTMENT_OTHER)
Admission: EM | Admit: 2024-01-23 | Discharge: 2024-01-23 | Disposition: A | Discharge status: Home / Self Care | Attending: Emergency Medicine | Admitting: Emergency Medicine

## 2024-01-23 ENCOUNTER — Encounter (HOSPITAL_BASED_OUTPATIENT_CLINIC_OR_DEPARTMENT_OTHER): Payer: Self-pay | Admitting: Emergency Medicine

## 2024-01-23 DIAGNOSIS — R319 Hematuria, unspecified: Secondary | ICD-10-CM | POA: Diagnosis present

## 2024-01-23 DIAGNOSIS — T839XXA Unspecified complication of genitourinary prosthetic device, implant and graft, initial encounter: Secondary | ICD-10-CM | POA: Diagnosis not present

## 2024-01-23 DIAGNOSIS — N3001 Acute cystitis with hematuria: Secondary | ICD-10-CM | POA: Insufficient documentation

## 2024-01-23 DIAGNOSIS — Y732 Prosthetic and other implants, materials and accessory gastroenterology and urology devices associated with adverse incidents: Secondary | ICD-10-CM | POA: Diagnosis not present

## 2024-01-23 HISTORY — DX: Benign prostatic hyperplasia without lower urinary tract symptoms: N40.0

## 2024-01-23 LAB — URINALYSIS, W/ REFLEX TO CULTURE (INFECTION SUSPECTED)
Bilirubin Urine: NEGATIVE
Glucose, UA: NEGATIVE mg/dL
Ketones, ur: NEGATIVE mg/dL
Nitrite: NEGATIVE
Protein, ur: 100 mg/dL — AB
RBC / HPF: >50 RBC/hpf (ref 0–5)
Specific Gravity, Urine: 1.025 (ref 1.005–1.030)
pH: 6 (ref 5.0–8.0)

## 2024-01-23 MED ORDER — CEPHALEXIN 250 MG PO CAPS
500.0000 mg | ORAL_CAPSULE | Freq: Once | ORAL | Status: AC
Start: 2024-01-23 — End: 2024-01-23
  Administered 2024-01-23: 500 mg via ORAL
  Filled 2024-01-23: qty 2

## 2024-01-23 MED ORDER — CEPHALEXIN 500 MG PO CAPS: 500.0000 mg | ORAL_CAPSULE | Freq: Four times a day (QID) | ORAL | 0 refills | Status: AC

## 2024-01-23 NOTE — ED Notes (Signed)

## 2024-01-23 NOTE — ED Provider Notes (Addendum)
  [facility] Provider Note   CSN: [id number] Arrival date & time: 01/23/24  1537     Patient presents with: Hematuria   Harry Spears is a 63 y.o. male.   Patient was seen 915 for urinary retention had Foley catheter with leg bag placed.  Has follow-up with [facility] urology on Tuesday.  They feel that this occurred secondary because he had a prostate biopsy.  And the prostate swelled.  Patient stated that his Foley catheter tube got kinked.  Was able to get it unkinked flowing fine.  But at first had some blood.  So is concerned about potential infection.  This all just happened today.  Past medical history significant for hypertension gastroesophageal reflux disease irritable bowel syndrome and benign prostatic hyperplasia.       Prior to Admission medications   Medication Sig Start Date End Date Taking? Authorizing Provider  cloNIDine  (CATAPRES ) 0.3 MG tablet Take 0.3 mg by mouth 2 (two) times daily. 05/16/21   [provider]  lidocaine  (LIDODERM ) 5 % Place 1 patch onto the skin daily. Remove & Discard patch within 12 hours or as directed by MD 12/09/19   Nettie, April, MD  losartan  (COZAAR ) 100 MG tablet Take 100 mg by mouth daily. 12/29/19   [provider]  naproxen  (NAPROSYN ) 375 MG tablet Take 1 tablet (375 mg total) by mouth 2 (two) times daily. 12/09/19   Palumbo, April, MD  pantoprazole (PROTONIX) 40 MG tablet Take 40 mg by mouth every morning. 03/10/21   [provider]  pravastatin (PRAVACHOL) 40 MG tablet Take by mouth. 08/06/16   [provider]  rosuvastatin (CRESTOR) 10 MG tablet Take 10 mg by mouth daily.    [provider]  urea  (CARMOL) 10 % cream Apply topically as needed. 08/24/22   Standiford, Marsa FALCON, DPM  zolpidem (AMBIEN) 10 MG tablet Take 10 mg by mouth at bedtime as needed for sleep.    [provider]    Allergies: Patient has no known allergies.    Review of  Systems  Constitutional:  Negative for chills and fever.  HENT:  Negative for ear pain and sore throat.   Eyes:  Negative for pain and visual disturbance.  Respiratory:  Negative for cough and shortness of breath.   Cardiovascular:  Negative for chest pain and palpitations.  Gastrointestinal:  Negative for abdominal pain and vomiting.  Genitourinary:  Positive for hematuria. Negative for dysuria.  Musculoskeletal:  Negative for arthralgias and back pain.  Skin:  Negative for color change and rash.  Neurological:  Negative for seizures and syncope.  All other systems reviewed and are negative.   Updated Vital Signs BP (!) 159/95 (BP Location: Right Arm)   Pulse 80   Temp 98.6 F (37 C)   Resp 18   Ht 1.803 m (5' 11)   Wt 88.5 kg   SpO2 99%   BMI 27.20 kg/m   Physical Exam Vitals and nursing note reviewed.  Constitutional:      General: He is not in acute distress.    Appearance: Normal appearance. He is well-developed.  HENT:     Head: Normocephalic and atraumatic.  Eyes:     Extraocular Movements: Extraocular movements intact.     Conjunctiva/sclera: Conjunctivae normal.     Pupils: Pupils are equal, round, and reactive to light.  Cardiovascular:     Rate and Rhythm: Normal rate and regular rhythm.     Heart sounds:  No murmur heard. Pulmonary:     Effort: Pulmonary effort is normal. No respiratory distress.     Breath sounds: Normal breath sounds.  Abdominal:     Palpations: Abdomen is soft.     Tenderness: There is no abdominal tenderness.  Genitourinary:    Comments: Foley catheter in place with good flow.  Urine clear. Musculoskeletal:        General: No swelling.     Cervical back: Neck supple.  Skin:    General: Skin is warm and dry.     Capillary Refill: Capillary refill takes less than 2 seconds.  Neurological:     Mental Status: He is alert.  Psychiatric:        Mood and Affect: Mood normal.     (all labs ordered are listed, but only abnormal  results are displayed) Labs Reviewed  URINALYSIS, W/ REFLEX TO CULTURE (INFECTION SUSPECTED) - Abnormal; Notable for the following components:      Result Value   APPearance CLOUDY (*)    Hgb urine dipstick LARGE (*)    Protein, ur 100 (*)    Leukocytes,Ua TRACE (*)    Bacteria, UA MANY (*)    All other components within normal limits  URINE CULTURE    EKG: None  Radiology: No results found.   Procedures   Medications Ordered in the ED - No data to display                                  Medical Decision Making Amount and/or Complexity of Data Reviewed Labs: ordered.   Will check UA.  Urinalysis here is cloudy.  White blood cells 6-10 RBCs greater than 50.  Could be due to trauma from the Foley catheter.  Will go ahead and send urine culture.  And will treat with some antibiotics in the meantime.  Also there was many bacteria.  Final diagnoses:  Problem with Foley catheter, initial encounter [facility])  Acute cystitis with hematuria    ED Discharge Orders     None          Geraldene Hamilton, MD 01/23/24 1648    Geraldene Hamilton, MD 01/23/24 1751

## 2024-01-23 NOTE — Discharge Instructions (Signed)
 Urinalysis suggestive of urinary tract infection.  Take the antibiotic Keflex  as directed.  Follow-up with [facility] urology as planned.  First dose of antibiotic given here.  Pick up the prescription and continue with the antibiotic starting tomorrow.

## 2024-01-23 NOTE — ED Notes (Signed)
 Pt advised he had a prostate biopsy done last week for CA eval. Came back clear but had swelling and could not void. Came in and had foley placed and went home. Last night his bed bag was attached and the tubing had rotated around itself and twisted the foley. No genital pain but he noticed red blood in the urine when he un-kinked the catheter. The pt emptied it and placed his leg collection bag and the blood seems to have dissipated and the urine is no longer opaque. Wants evaluation for same.

## 2024-01-23 NOTE — ED Triage Notes (Signed)
 Pt with indwelling foley catheter (placed 9/15); was concerned because earlier the catheter was twisted and when he released it he noticed blood in the urine; urine now appears clear, but would still like to be checked for infection

## 2024-01-25 LAB — URINE CULTURE: Culture: >=100000 — AB

## 2024-01-26 ENCOUNTER — Telehealth (HOSPITAL_BASED_OUTPATIENT_CLINIC_OR_DEPARTMENT_OTHER): Payer: Self-pay

## 2024-01-26 NOTE — Telephone Encounter (Signed)
 Post ED Visit - Positive Culture Follow-up  Culture report reviewed by antimicrobial stewardship pharmacist: [facility] [x]  Koren Or, Pharm.D. []  Venetia Gully, Pharm.D., BCPS AQ-ID []  Garrel Crews, Pharm.D., BCPS []  Almarie Lunger, Pharm.D., BCPS []  [place], [place].D., BCPS, AAHIVP []  Rosaline Bihari, Pharm.D., BCPS, AAHIVP []  Vernell Meier, PharmD, BCPS []  Latanya Hint, PharmD, BCPS []  Donald Medley, PharmD, BCPS []  Rocky Bold, PharmD []  Dorothyann Alert, PharmD, BCPS []  Morene Babe, PharmD  [facility] []  Rosaline Edison, PharmD []  Romona Bliss, PharmD []  Dolphus Roller, PharmD []  Veva Seip, Rph []  Vernell Daunt) Leonce, PharmD []  Eva Allis, PharmD []  Rosaline Millet, PharmD []  Iantha Batch, PharmD []  Arvin Gauss, PharmD []  Wanda Hasting, PharmD []  Ronal Rav, PharmD []  Rocky Slade, PharmD []  Bard Jeans, PharmD   Positive urine culture Reviewed by ED provider Lonni Sakai, MD Treated with Cephalexin .  Patient c/o urinary retention, foley catheter tubing kinked, able to get unkinked and draining. Follow up with urology on 01/25/24. UA positive, vitals WNL. No further patient follow-up is required at this time.  Harry Spears 01/26/2024, 11:44 AM

## 2024-02-04 ENCOUNTER — Encounter (HOSPITAL_BASED_OUTPATIENT_CLINIC_OR_DEPARTMENT_OTHER): Payer: Self-pay | Admitting: Family Medicine

## 2024-04-29 ENCOUNTER — Emergency Department (HOSPITAL_BASED_OUTPATIENT_CLINIC_OR_DEPARTMENT_OTHER)
Admission: EM | Admit: 2024-04-29 | Discharge: 2024-04-29 | Discharge status: Against Medical Advice | Attending: Emergency Medicine | Admitting: Emergency Medicine

## 2024-04-29 ENCOUNTER — Other Ambulatory Visit: Payer: Self-pay

## 2024-04-29 ENCOUNTER — Encounter (HOSPITAL_BASED_OUTPATIENT_CLINIC_OR_DEPARTMENT_OTHER): Payer: Self-pay

## 2024-04-29 DIAGNOSIS — H538 Other visual disturbances: Secondary | ICD-10-CM | POA: Diagnosis not present

## 2024-04-29 DIAGNOSIS — R110 Nausea: Secondary | ICD-10-CM | POA: Insufficient documentation

## 2024-04-29 DIAGNOSIS — Z5321 Procedure and treatment not carried out due to patient leaving prior to being seen by health care provider: Secondary | ICD-10-CM | POA: Diagnosis not present

## 2024-04-29 DIAGNOSIS — M545 Low back pain, unspecified: Secondary | ICD-10-CM | POA: Insufficient documentation

## 2024-04-29 LAB — CBC
HCT: 44.2 % (ref 39.0–52.0)
Hemoglobin: 15.1 g/dL (ref 13.0–17.0)
MCH: 29.6 pg (ref 26.0–34.0)
MCHC: 34.2 g/dL (ref 30.0–36.0)
MCV: 86.7 fL (ref 80.0–100.0)
Platelets: 187 K/uL (ref 150–400)
RBC: 5.1 MIL/uL (ref 4.22–5.81)
RDW: 12.6 % (ref 11.5–15.5)
WBC: 5.2 K/uL (ref 4.0–10.5)
nRBC: 0 % (ref 0.0–0.2)

## 2024-04-29 LAB — URINALYSIS, ROUTINE W REFLEX MICROSCOPIC
Bilirubin Urine: NEGATIVE
Glucose, UA: NEGATIVE mg/dL
Hgb urine dipstick: NEGATIVE
Ketones, ur: NEGATIVE mg/dL
Leukocytes,Ua: NEGATIVE
Nitrite: NEGATIVE
Protein, ur: NEGATIVE mg/dL
Specific Gravity, Urine: >=1.03 (ref 1.005–1.030)
pH: 5.5 (ref 5.0–8.0)

## 2024-04-29 LAB — COMPREHENSIVE METABOLIC PANEL WITH GFR
ALT: 29 U/L (ref 0–44)
AST: 26 U/L (ref 15–41)
Albumin: 4.3 g/dL (ref 3.5–5.0)
Alkaline Phosphatase: 65 U/L (ref 38–126)
Anion gap: 10 (ref 5–15)
BUN: 20 mg/dL (ref 8–23)
CO2: 26 mmol/L (ref 22–32)
Calcium: 9.3 mg/dL (ref 8.9–10.3)
Chloride: 99 mmol/L (ref 98–111)
Creatinine, Ser: 1.19 mg/dL (ref 0.61–1.24)
GFR, Estimated: >60 mL/min
Glucose, Bld: 113 mg/dL — ABNORMAL HIGH (ref 70–99)
Potassium: 4.2 mmol/L (ref 3.5–5.1)
Sodium: 134 mmol/L — ABNORMAL LOW (ref 135–145)
Total Bilirubin: 0.5 mg/dL (ref 0.0–1.2)
Total Protein: 7 g/dL (ref 6.5–8.1)

## 2024-04-29 LAB — LIPASE, BLOOD: Lipase: 37 U/L (ref 11–51)

## 2024-04-29 LAB — CBG MONITORING, ED: Glucose-Capillary: 118 mg/dL — ABNORMAL HIGH (ref 70–99)

## 2024-04-29 NOTE — ED Triage Notes (Signed)
 Reports lower abd pain, lower back pain, blurred vision, dry mouth, nausea, increased urinary frequency for multiple months. Symptoms worsened today

## 2024-05-16 DIAGNOSIS — R079 Chest pain, unspecified: Secondary | ICD-10-CM | POA: Diagnosis not present

## 2024-06-19 ENCOUNTER — Ambulatory Visit: Admitting: Podiatry
